# Patient Record
Sex: Female | Born: 1990 | Race: White | Hispanic: No | Marital: Married | Smoking: Never smoker
Health system: Southern US, Community
[De-identification: ages and names within clinical notes are randomized; demographics above are authoritative.]

## PROBLEM LIST (undated history)

## (undated) ENCOUNTER — Inpatient Hospital Stay (HOSPITAL_COMMUNITY): Payer: Self-pay

## (undated) DIAGNOSIS — K219 Gastro-esophageal reflux disease without esophagitis: Secondary | ICD-10-CM

## (undated) DIAGNOSIS — I471 Supraventricular tachycardia: Secondary | ICD-10-CM

## (undated) DIAGNOSIS — N390 Urinary tract infection, site not specified: Secondary | ICD-10-CM

## (undated) DIAGNOSIS — O163 Unspecified maternal hypertension, third trimester: Secondary | ICD-10-CM

## (undated) HISTORY — PX: ADENOIDECTOMY: SUR15

## (undated) HISTORY — DX: Urinary tract infection, site not specified: N39.0

---
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
## 2009-11-29 DIAGNOSIS — I471 Supraventricular tachycardia, unspecified: Secondary | ICD-10-CM

## 2009-11-29 HISTORY — DX: Supraventricular tachycardia, unspecified: I47.10

## 2009-11-29 HISTORY — DX: Supraventricular tachycardia: I47.1

## 2010-03-01 NOTE — L&D Delivery Note (Signed)
Operative Delivery Note At 11:21 PM a viable and healthy female was delivered via Vaginal, Spontaneous Delivery.  Presentation: vertex; Position: Right,, Occiput,, Anterior; Station: +4.  Verbal consent: obtained from patient.  Risks and benefits discussed in detail.  Risks include, but are not limited to the risks of anesthesia, bleeding, infection, damage to maternal tissues, fetal cephalhematoma.  There is also the risk of inability to effect vaginal delivery of the head, or shoulder dystocia that cannot be resolved by established maneuvers, leading to the need for emergency cesarean section.  APGAR: 9,9 ; weight 7 pounds and 2 ounces   Placenta status: spontaneous and intact   Cord: 3 vessels with the following complications: None.  Cord pH: na  Anesthesia: Epidural  Instruments: Kiwi cup x one pull, no pop offs Episiotomy: None Lacerations: 2nd degree Suture Repair: 2.0 3.0 vicryl rapide Est. Blood Loss (mL): 300  Mom to postpartum.  Baby to nursery-stable.  Kammi Hechler J 02/28/2011, 11:34 PM

## 2010-09-04 LAB — CULTURE, BETA STREP (GROUP B ONLY): Organism ID, Bacteria: POSITIVE

## 2010-09-19 ENCOUNTER — Encounter (HOSPITAL_COMMUNITY): Payer: Self-pay

## 2010-09-19 ENCOUNTER — Inpatient Hospital Stay (HOSPITAL_COMMUNITY)
Admission: AD | Admit: 2010-09-19 | Discharge: 2010-09-20 | DRG: 781 | Disposition: A | Discharge status: Home / Self Care | Payer: PRIVATE HEALTH INSURANCE | Source: Ambulatory Visit | Attending: Obstetrics and Gynecology | Admitting: Obstetrics and Gynecology

## 2010-09-19 ENCOUNTER — Inpatient Hospital Stay (HOSPITAL_COMMUNITY): Payer: PRIVATE HEALTH INSURANCE

## 2010-09-19 DIAGNOSIS — N390 Urinary tract infection, site not specified: Secondary | ICD-10-CM | POA: Diagnosis present

## 2010-09-19 DIAGNOSIS — A498 Other bacterial infections of unspecified site: Secondary | ICD-10-CM | POA: Diagnosis present

## 2010-09-19 DIAGNOSIS — O2390 Unspecified genitourinary tract infection in pregnancy, unspecified trimester: Principal | ICD-10-CM | POA: Diagnosis present

## 2010-09-19 DIAGNOSIS — O99891 Other specified diseases and conditions complicating pregnancy: Secondary | ICD-10-CM | POA: Diagnosis present

## 2010-09-19 DIAGNOSIS — R109 Unspecified abdominal pain: Secondary | ICD-10-CM | POA: Diagnosis present

## 2010-09-19 DIAGNOSIS — Z22330 Carrier of Group B streptococcus: Secondary | ICD-10-CM

## 2010-09-19 DIAGNOSIS — N200 Calculus of kidney: Secondary | ICD-10-CM | POA: Diagnosis present

## 2010-09-19 HISTORY — DX: Gastro-esophageal reflux disease without esophagitis: K21.9

## 2010-09-19 HISTORY — DX: Supraventricular tachycardia: I47.1

## 2010-09-19 LAB — DIFFERENTIAL
Basophils Absolute: 0 10*3/uL (ref 0.0–0.1)
Basophils Relative: 0 % (ref 0–1)
Eosinophils Absolute: 0.1 10*3/uL (ref 0.0–0.7)
Eosinophils Relative: 1 % (ref 0–5)
Lymphocytes Relative: 24 % (ref 12–46)
Lymphs Abs: 2.5 10*3/uL (ref 0.7–4.0)
Monocytes Absolute: 0.8 10*3/uL (ref 0.1–1.0)
Monocytes Relative: 7 % (ref 3–12)
Neutro Abs: 7.3 10*3/uL (ref 1.7–7.7)
Neutrophils Relative %: 68 % (ref 43–77)

## 2010-09-19 LAB — ABO/RH: RH Type: POSITIVE

## 2010-09-19 LAB — URINALYSIS, ROUTINE W REFLEX MICROSCOPIC
Bilirubin Urine: NEGATIVE
Leukocytes, UA: NEGATIVE
Nitrite: NEGATIVE
Specific Gravity, Urine: 1.02 (ref 1.005–1.030)
Urobilinogen, UA: 0.2 mg/dL (ref 0.0–1.0)
pH: 6 (ref 5.0–8.0)

## 2010-09-19 LAB — CBC
HCT: 36.4 % (ref 36.0–46.0)
Hemoglobin: 12.7 g/dL (ref 12.0–15.0)
MCH: 29.7 pg (ref 26.0–34.0)
MCHC: 34.9 g/dL (ref 30.0–36.0)
MCV: 85.2 fL (ref 78.0–100.0)
Platelets: 165 10*3/uL (ref 150–400)
RBC: 4.27 MIL/uL (ref 3.87–5.11)
RDW: 13 % (ref 11.5–15.5)
WBC: 10.7 10*3/uL — ABNORMAL HIGH (ref 4.0–10.5)

## 2010-09-19 MED ORDER — LACTATED RINGERS IV SOLN
INTRAVENOUS | Status: DC
  Administered 2010-09-19 – 2010-09-20 (×2): via INTRAVENOUS

## 2010-09-19 MED ORDER — SODIUM CHLORIDE 0.9 % IJ SOLN: 3.0000 mL | Freq: Two times a day (BID) | INTRAMUSCULAR | Status: DC

## 2010-09-19 MED ORDER — SODIUM CHLORIDE 0.9 % IV SOLN: Freq: Two times a day (BID) | INTRAVENOUS | Status: DC | PRN

## 2010-09-19 MED ORDER — PROMETHAZINE HCL 25 MG/ML IJ SOLN: 12.5000 mg | INTRAMUSCULAR | Status: DC | PRN

## 2010-09-19 MED ORDER — DEXTROSE 5 % IV SOLN
1.0000 g | Freq: Two times a day (BID) | INTRAVENOUS | Status: DC
  Administered 2010-09-19 – 2010-09-20 (×2): 1 g via INTRAVENOUS
  Filled 2010-09-19 (×4): qty 1

## 2010-09-19 MED ORDER — PRENATAL PLUS 27-1 MG PO TABS
1.0000 | ORAL_TABLET | Freq: Every day | ORAL | Status: DC
  Filled 2010-09-19 (×2): qty 1

## 2010-09-19 MED ORDER — SODIUM CHLORIDE 0.9 % IJ SOLN: 3.0000 mL | INTRAMUSCULAR | Status: DC | PRN

## 2010-09-19 MED ORDER — SODIUM CHLORIDE 0.9 % IV SOLN: 250.0000 mL | INTRAVENOUS | Status: DC

## 2010-09-19 MED ORDER — HYDROMORPHONE HCL 1 MG/ML IJ SOLN
0.5000 mg | INTRAMUSCULAR | Status: DC | PRN
  Administered 2010-09-20: 0.5 mg via INTRAVENOUS
  Filled 2010-09-19: qty 1

## 2010-09-19 MED ORDER — TRAMADOL HCL 50 MG PO TABS
50.0000 mg | ORAL_TABLET | Freq: Four times a day (QID) | ORAL | Status: DC | PRN
  Administered 2010-09-19: 50 mg via ORAL
  Filled 2010-09-19: qty 1

## 2010-09-19 MED ORDER — NALBUPHINE HCL 10 MG/ML IJ SOLN
10.0000 mg | Freq: Four times a day (QID) | INTRAMUSCULAR | Status: DC | PRN
  Administered 2010-09-19: 10 mg via INTRAVENOUS
  Filled 2010-09-19: qty 1

## 2010-09-19 NOTE — ED Provider Notes (Signed)
History     Chief Complaint  Patient presents with  . Flank Pain  . Urinary Tract Infection   Flank Pain  Urinary Tract Infection  Associated symptoms include flank pain.    OB History    Grav Para Term Preterm Abortions TAB SAB Ect Mult Living   1               No past medical history on file.  No past surgical history on file.  No family history on file.  History  Substance Use Topics  . Smoking status: Not on file  . Smokeless tobacco: Not on file  . Alcohol Use:     Allergies:  Allergies  Allergen Reactions  . Sulfa Antibiotics Rash    No prescriptions prior to admission    Review of Systems  Genitourinary: Positive for flank pain.    Onset acute flank pain Left side today. Recent UTI with GBS treated with 7 day course of Keflex. TOC positive for persistent GBS and Ecoli - initiated on 10 day course Macrobid, currently on day 3 of course. Denies fever or chills. No vaginal bleeding, vaginal discharge, cramping, or fluid leakage. Occasional ligament pain with movement. Pain is persistent with sharp pain at times. Pain 7/10 - desires analgesia. Some mild nausea, decreased appetite with pain. No vomiting. Normal BM - last BM yesterday.  Physical Exam   Blood pressure 125/89, pulse 85, temperature 98 F (36.7 C), resp. rate 18, height 5\' 5"  (1.651 m), weight 67.767 kg (149 lb 6.4 oz).  Physical Exam Alert and oriented x 3. Uncomfortable with pain Left flank area. Heart: RRR and Lungs: clear  Abdomen : soft, non-tender, non-distended                    fundal height mid-way between symphysis and umbilicus / nontender                    CVA Right - negative / CVA Left - positive Defer pelvic exam FHR per doppler 145 Ext: normal, no edema, no varicosities  MAU Course  Procedures : renal sono ordered   Assessment and Plan  [redacted] weeks gestation Urinary tract infection - possible ascending UTI Flank Pain - possible nephrolithiasis  Plan: 1)  urinalysis 2) CBCD 3) renal sono 4) IVF hydration and analgesia 5) re-assess after renal sono and anlagesia  Amanda Barker 09/19/2010, 5:33 PM

## 2010-09-19 NOTE — Progress Notes (Signed)
Pt reports having a UTI for the past month. Pt was on Keflex, repeat UA showed no clearing of the infection plus E. Coli. Pt has been on Macrobid for 2 days. Today pt reports L sided flank pain.

## 2010-09-19 NOTE — Progress Notes (Signed)
S: Intense pain again - nubain helped initially but now increasing again.   No nausea. Voiding QS with IV hydration.  O: 98.3 - 79 - 16 - 115/57  RADIOLOGY REPORT*  Clinical Data: [redacted] weeks pregnant with flank pain.  RENAL/URINARY TRACT ULTRASOUND COMPLETE  Comparison: None.  Findings:  Right Kidney: Normal in size and parenchymal echogenicity. No  evidence of mass or hydronephrosis.  Left Kidney: Normal in size and parenchymal echogenicity. No  evidence of mass or hydronephrosis.  Bladder: Appears normal for degree of bladder distention.  Bilateral ureteral jets are seen.  IMPRESSION:  Normal study.   Labs reviewed :  WBC 10.7 / HGB 12.7 / HCT 36.4 /  PLT 165 Urinalysis: 1020 specific gravity / negative micro  Assessment ; UTI Flank Pain Possible small kidney stone  Pain Oral analgesia now - if no improvement in 1 hour will admit  Marlinda Mike CNM MSN

## 2010-09-19 NOTE — Progress Notes (Signed)
Was diagnosed with UTI about one month ago was given Keflex diagnosed with E-Coli on Macrobid (3 doses) , onset of left flank pain today

## 2010-09-19 NOTE — H&P (Signed)
  Amanda Barker is a 20 y.o. female presenting for acute flank pain. Pregnancy at 16 weeks. PNCare at [facility] with Dr Amanda Barker as primary.   OB History    Grav Para Term Preterm Abortions TAB SAB Ect Mult Living   1              No past medical history on file. No past surgical history on file. Family History: family history is not on file. Social History:  does not have a smoking history on file. She does not have any smokeless tobacco history on file. Her alcohol and drug histories not on file.  Recent UTI - completed initial course of Keflex for GBS bacturia - TOC positive for GBS and Ecoli - on day 3 on Macrobid x 7 day course. No fever or chills.  No vaginal discharge, bleeding , or cramping. Normal BM, mild nausea, no vomiting.   see MAU note for physical exam  Assessment/Plan: [redacted] weeks pregnant Flank pain UTI - possible ascending / no evidence of pyelonephritis Possible nephrolithiasis / no visible stones or hydronephrosis on sono  1) Admit - pain management 2) Iv analgesia and antiemetic 3) Iv antibiotics while in-patient - restart Macrobid at time of discharge   Amanda Barker 09/19/2010, 10:18 PM

## 2010-09-20 ENCOUNTER — Other Ambulatory Visit: Payer: Self-pay

## 2010-09-20 ENCOUNTER — Encounter (HOSPITAL_COMMUNITY): Payer: Self-pay | Admitting: *Deleted

## 2010-09-20 DIAGNOSIS — N200 Calculus of kidney: Secondary | ICD-10-CM | POA: Diagnosis present

## 2010-09-20 DIAGNOSIS — I471 Supraventricular tachycardia, unspecified: Secondary | ICD-10-CM | POA: Insufficient documentation

## 2010-09-20 DIAGNOSIS — N390 Urinary tract infection, site not specified: Secondary | ICD-10-CM

## 2010-09-20 LAB — TSH: TSH: 2.074 u[IU]/mL (ref 0.350–4.500)

## 2010-09-20 LAB — GLUCOSE, CAPILLARY: Glucose-Capillary: 84 mg/dL (ref 70–99)

## 2010-09-20 MED ORDER — TRAMADOL HCL 50 MG PO TABS: 50.0000 mg | ORAL_TABLET | Freq: Four times a day (QID) | ORAL | Status: AC | PRN

## 2010-09-20 NOTE — Progress Notes (Addendum)
At 0115, I was called to patient's room. Patient complained of shortness of breath and labored breathing. She stated she was woken up from her sleep gasping for air. Patient visibly shaking, appears anxious. I auscultated patient's apical pulse, irregular. Vitals taken, stable (see doc flow sheets) pulse 100. I called respiratory to have a stat EKG (per Rennis Harding verbal report from speaking with her earlier). EKG was done, showing PAC's. Patient stated that she overall "just didn't feel good". I checked patient's CBG, it was 84. I called Marlinda Mike at [phone], notifying her of this event. Orders given to decrease continuous fluids. She gave instructions to give Tramadol as ordered instead of ordered Dilaudid. She mentioned having a cardiology consult in the morning.  Fluids decreased. Patient informed of plan of care. Patient verbalized understanding.  Will continue to monitor.

## 2010-09-20 NOTE — Progress Notes (Signed)
  S: Feeling much better - only slight backache on left this am 2/10     Tolerating PO intake     No palpitations this am - hx prior to pregnancy but only rarely     "sand particles" in urine strainer early this am - pain stopped after that time / urine dark brownish color with red streaks  O:  VS: Blood pressure 93/56, pulse 74, temperature 97.6 F (36.4 C), temperature source Oral, resp. rate 18, height 5\' 5"  (1.651 m), weight 67.767 kg (149 lb 6.4 oz), SpO2 99.00%.        FHR : 140-150 per doppler       Heart rate 80 with regular rhythm mild murmur       Lungs - clear, unlabored       Abdomen - soft, nontender       Urine dark brown this am with scant red blood / + stone particles in strainer       PAC per ECG - symptomatic after narcotic med / sedation       No previous cardiology evlaution             A: 16 weeks      UTI       Flank pain resolved - related to nephrolithiasis  P: D/C home today after 2nd dose of Rocephin      Continue Macrobid course at home - take all meds      Push PO meds      No calcium supplements - may need to change prenatal to low calcium option      OV at [facility] Friday as scheduled / will schedule outpt cardiology referral      No caffeine - will stimulate palpitations                 Reedy Biernat 09/20/2010, 7:53 AM

## 2010-09-20 NOTE — Discharge Summary (Signed)
      Feeling much better - only slight backache on left this am      Tolerating PO intake     No palpitations this am - hx prior to pregnancy but only rarely     "sand particles" in urine strainer early this am - pain stopped after that time / urine dark brownish color with red streaks       VS: Blood pressure 93/56, pulse 74, temperature 97.6 F (36.4 C), temperature source Oral, resp. rate 18, height 5\' 5"  (1.651 m), weight 67.767 kg (149 lb 6.4 oz), SpO2 99.00%.       FHR : 140-150 per doppler       Heart rate 80 with regular rhythm mild murmur       Lungs - clear, unlabored       Abdomen - soft, nontender       Urine dark brown this am with scant red blood / + stone particles in strainer       PAC per ECG - symptomatic after narcotic med / sedation       No previous cardiology evlaution             A: 16 weeks      UTI - GBS and Ecoli      Flank pain resolved - related to nephrolithiasis      PAC  P: D/C home today after 2nd dose of Rocephin      Continue Macrobid course at home - take all meds      Push PO meds      Regular diet      Activity ad lib      No calcium supplements - may need to change prenatal to low calcium option      OV at [facility] Friday as scheduled / will schedule outpt cardiology referral      No caffeine - will stimulate palpitations

## 2011-02-21 ENCOUNTER — Encounter (HOSPITAL_COMMUNITY): Payer: Self-pay | Admitting: *Deleted

## 2011-02-21 ENCOUNTER — Inpatient Hospital Stay (HOSPITAL_COMMUNITY)
Admission: AD | Admit: 2011-02-21 | Discharge: 2011-02-22 | Disposition: A | Discharge status: Home / Self Care | Payer: 59 | Source: Ambulatory Visit | Attending: Obstetrics | Admitting: Obstetrics

## 2011-02-21 DIAGNOSIS — O479 False labor, unspecified: Secondary | ICD-10-CM | POA: Insufficient documentation

## 2011-02-21 NOTE — Progress Notes (Signed)
Notified of pt presenting for labor check.  Notified of VE and ctx pattern.  Orders to recheck cervix after one hour.

## 2011-02-21 NOTE — Progress Notes (Signed)
Pt states contractions present since 1500

## 2011-02-22 NOTE — Progress Notes (Signed)
Monitors dc'd as pt wishes to stay out of bed at this time.  Reactive fetal tracing reviewed with charge nurse.

## 2011-02-22 NOTE — Consult Note (Signed)
NAMEMAKENZEE, CHOUDHRY           ACCOUNT NO.:  [id number]  MEDICAL RECORD NO.:  [id number]  LOCATION:  [facility]                          FACILITY:  [facility]  PHYSICIAN:  Lenoard Aden, M.D.DATE OF BIRTH:  1990/06/28  DATE OF CONSULTATION:  02/21/2011 DATE OF DISCHARGE:  02/22/2011                                CONSULTATION   CHIEF COMPLAINT:  Labor.  HISTORY OF PRESENT ILLNESS:  She is a 20 year old white female, G1, P0, at 37-1/7 weeks with increased contractions this evening.  She reports good fetal movement.  Denies bleeding or leakage of fluid.  MEDICATIONS:  Include prenatal vitamins.  ALLERGIES:  To SULFA,  XYLOCAINE, and LOCAL ANESTHETIC.  PAST MEDICAL HISTORY:  Remarkable for gastroesophageal reflux, supraventricular tachycardia and history of UTI.  This is her first pregnancy, and pregnancy course also complicated by GBS in the urine.  PHYSICAL EXAMINATION:  GENERAL:  This is a well-developed, well- nourished, white female, in no acute distress. VITAL SIGNS:  Blood pressure 150/80. HEENT:  Normal. NECK:  Supple.  Full range of motion. LUNGS:  Clear. HEART:  Regular rate and rhythm. ABDOMEN:  Soft, gravid and nontender.  Cervix is 2-3 cm, 50%, vertex -2. EXTREMITIES:  There are no cords. NEUROLOGIC:  Nonfocal. SKIN:  Intact.  NST reactive with irregular contractions every 4-6 minutes.  IMPRESSION:  Prodromal labor at 38 weeks.  PLAN:  Ambulate and recheck x1 hour.  Discharge home versus readmission pending recheck.    Lenoard Aden, M.D.    RJT/MEDQ  D:  02/21/2011  T:  02/22/2011  Job:  [id number]

## 2011-02-22 NOTE — Progress Notes (Signed)
Notified of subtle cervical exam change. Notified of increase in contractions.  May monitor for one more hour.  If no cervical change dc home.

## 2011-02-26 ENCOUNTER — Encounter (HOSPITAL_COMMUNITY): Payer: Self-pay | Admitting: *Deleted

## 2011-02-26 ENCOUNTER — Telehealth (HOSPITAL_COMMUNITY): Payer: Self-pay | Admitting: *Deleted

## 2011-02-26 NOTE — Telephone Encounter (Signed)
Preadmission screen  

## 2011-02-27 ENCOUNTER — Encounter (HOSPITAL_COMMUNITY): Payer: Self-pay

## 2011-02-27 ENCOUNTER — Other Ambulatory Visit: Payer: Self-pay | Admitting: Obstetrics and Gynecology

## 2011-02-27 ENCOUNTER — Inpatient Hospital Stay (HOSPITAL_COMMUNITY)
Admission: RE | Admit: 2011-02-27 | Discharge: 2011-03-02 | DRG: 775 | Disposition: A | Discharge status: Home / Self Care | Payer: 59 | Source: Ambulatory Visit | Attending: Obstetrics and Gynecology | Admitting: Obstetrics and Gynecology

## 2011-02-27 DIAGNOSIS — Z22330 Carrier of Group B streptococcus: Secondary | ICD-10-CM

## 2011-02-27 DIAGNOSIS — O4100X0 Oligohydramnios, unspecified trimester, not applicable or unspecified: Principal | ICD-10-CM | POA: Diagnosis present

## 2011-02-27 DIAGNOSIS — O99892 Other specified diseases and conditions complicating childbirth: Secondary | ICD-10-CM | POA: Diagnosis present

## 2011-02-27 LAB — CBC
MCH: 29.9 pg (ref 26.0–34.0)
Platelets: 159 10*3/uL (ref 150–400)
RBC: 3.75 MIL/uL — ABNORMAL LOW (ref 3.87–5.11)
RDW: 12.8 % (ref 11.5–15.5)
WBC: 9.6 10*3/uL (ref 4.0–10.5)

## 2011-02-27 MED ORDER — ZOLPIDEM TARTRATE 10 MG PO TABS
10.0000 mg | ORAL_TABLET | Freq: Every evening | ORAL | Status: DC | PRN
  Administered 2011-02-27: 10 mg via ORAL
  Filled 2011-02-27: qty 1

## 2011-02-27 MED ORDER — LIDOCAINE HCL (PF) 1 % IJ SOLN
30.0000 mL | INTRAMUSCULAR | Status: DC | PRN
  Filled 2011-02-27: qty 30

## 2011-02-27 MED ORDER — OXYCODONE-ACETAMINOPHEN 5-325 MG PO TABS: 2.0000 | ORAL_TABLET | ORAL | Status: DC | PRN

## 2011-02-27 MED ORDER — LACTATED RINGERS IV SOLN
INTRAVENOUS | Status: DC
  Administered 2011-02-28 (×2): via INTRAVENOUS

## 2011-02-27 MED ORDER — CITRIC ACID-SODIUM CITRATE 334-500 MG/5ML PO SOLN: 30.0000 mL | ORAL | Status: DC | PRN

## 2011-02-27 MED ORDER — DINOPROSTONE 10 MG VA INST
10.0000 mg | VAGINAL_INSERT | Freq: Once | VAGINAL | Status: AC
  Administered 2011-02-27: 10 mg via VAGINAL
  Filled 2011-02-27: qty 1

## 2011-02-27 MED ORDER — ACETAMINOPHEN 325 MG PO TABS: 650.0000 mg | ORAL_TABLET | ORAL | Status: DC | PRN

## 2011-02-27 MED ORDER — IBUPROFEN 600 MG PO TABS
600.0000 mg | ORAL_TABLET | Freq: Four times a day (QID) | ORAL | Status: DC | PRN
  Administered 2011-03-01: 600 mg via ORAL
  Filled 2011-02-27: qty 1

## 2011-02-27 MED ORDER — TERBUTALINE SULFATE 1 MG/ML IJ SOLN: 0.2500 mg | Freq: Once | INTRAMUSCULAR | Status: AC | PRN

## 2011-02-27 MED ORDER — PENICILLIN G POTASSIUM 5000000 UNITS IJ SOLR
2.5000 10*6.[IU] | INTRAVENOUS | Status: DC
  Administered 2011-02-28 (×3): 2.5 10*6.[IU] via INTRAVENOUS
  Filled 2011-02-27 (×8): qty 2.5

## 2011-02-27 MED ORDER — LACTATED RINGERS IV SOLN
500.0000 mL | INTRAVENOUS | Status: DC | PRN
  Administered 2011-02-28: 1000 mL via INTRAVENOUS

## 2011-02-27 MED ORDER — PENICILLIN G POTASSIUM 5000000 UNITS IJ SOLR
5.0000 10*6.[IU] | Freq: Once | INTRAVENOUS | Status: AC
  Administered 2011-02-28: 5 10*6.[IU] via INTRAVENOUS
  Filled 2011-02-27: qty 5

## 2011-02-27 MED ORDER — ONDANSETRON HCL 4 MG/2ML IJ SOLN
4.0000 mg | Freq: Four times a day (QID) | INTRAMUSCULAR | Status: DC | PRN
  Administered 2011-02-28: 4 mg via INTRAVENOUS
  Filled 2011-02-27: qty 2

## 2011-02-27 NOTE — H&P (Signed)
Amanda Barker, LEIST           ACCOUNT NO.:  [id number]  MEDICAL RECORD NO.:  [id number]  LOCATION:  9163                          [facility]ACILITY:  [facility]  PHYSICIAN:  Lenoard Aden, M.D.DATE OF BIRTH:  January 29, 1991  DATE OF ADMISSION:  02/27/2011 DATE OF DISCHARGE:                             HISTORY & PHYSICAL   CHIEF COMPLAINT:  Oligohydramnios with history of continued leakage of fluid, but otherwise normal the dropping AFI over the past week and GBS positive for induction now at 39 weeks due to concerns of questionable high leak of amniotic fluid.  HISTORY OF PRESENT ILLNESS:  She is a 20 year old white female, G1, P0 with aforementioned history who presents now for induction.  PAST MEDICAL HISTORY:  Remarkable.  ALLERGIES:  Sulfa and Xylocaine.  MEDICATIONS:  Prenatal vitamins.  She has a history of GBS bacteriuria.  FAMILY HISTORY:  Hypertension and diabetes.  SOCIAL HISTORY:  She is a nonsmoker, nondrinker.  She denies domestic or physical violence.  This is her first pregnancy.  PHYSICAL EXAMINATION:  GENERAL:  She is a well-developed, well- nourished, white female, in no acute distress. HEENT:  Normal. NECK:  Supple.  Full range of motion. LUNGS:  Clear. HEART:  Regular rate and rhythm. ABDOMEN:  Soft, gravid, nontender.  Estimated fetal weight 7 pounds. Cervix is 2-3 cm, 70%, vertex -1. EXTREMITIES:  There are no cords. NEUROLOGIC:  Nonfocal. SKIN:  Intact.  IMPRESSION: 1. Thirty-nine week intrauterine pregnancy. 2. Questionable history of leakage of fluid.  History of repeat     negative fern and Nitrazine with dropping AFI noted on ultrasound.     Given concerns regarding possible high leak and a history of GBS     bacteriuria, decision was made to proceed with delivery at 39     weeks.  PLAN:  Place Cervidil tonight and Pitocin in a.m.  Start penicillin with active labor.  Anticipate cautious attempts for vaginal delivery.     Lenoard Aden,  M.D.     RJT/MEDQ  D:  02/27/2011  T:  02/27/2011  Job:  [id number]

## 2011-02-27 NOTE — Progress Notes (Signed)
Mushka Laconte is a 20 y.o. G1P0000 at 108w0d by LMP admitted for oligohydramnios with ? LOF over last week. Sono c/w with dropping AFI.   Subjective: Good FM ?LOF   Objective: BP 137/94  Pulse 105  Temp(Src) 97.9 F (36.6 C) (Oral)  Resp 18  Ht 5\' 5"  (1.651 m)  Wt 87.544 kg (193 lb)  BMI 32.12 kg/m2      FHT: FHT 150s , reactive, CAtegory 1 UC:   rare SVE:    2-3/60-1  Cervidil placed. Labs: Lab Results  Component Value Date   WBC 10.7* 09/19/2010   HGB 12.7 09/19/2010   HCT 36.4 09/19/2010   MCV 85.2 09/19/2010   PLT 165 09/19/2010    Assessment / Plan: 39 week IUP Oligohydramnios-AGA GBS Bacteruria  Labor: Cervidil tonight, Pitocin in am Preeclampsia:  na Fetal Wellbeing:  Category 1 Pain Control:  Labor support without medications I/D:  n/a Anticipated MOD:  NSVD  Shaliah Wann J 02/27/2011, 8:25 PM

## 2011-02-28 ENCOUNTER — Inpatient Hospital Stay (HOSPITAL_COMMUNITY): Payer: 59 | Admitting: Anesthesiology

## 2011-02-28 ENCOUNTER — Encounter (HOSPITAL_COMMUNITY): Payer: Self-pay | Admitting: Anesthesiology

## 2011-02-28 DIAGNOSIS — O4100X0 Oligohydramnios, unspecified trimester, not applicable or unspecified: Secondary | ICD-10-CM | POA: Diagnosis present

## 2011-02-28 LAB — RPR: RPR Ser Ql: NONREACTIVE

## 2011-02-28 MED ORDER — OXYTOCIN BOLUS FROM INFUSION
500.0000 mL | Freq: Once | INTRAVENOUS | Status: DC
  Filled 2011-02-28: qty 500
  Filled 2011-02-28: qty 1000

## 2011-02-28 MED ORDER — OXYTOCIN 20 UNITS IN LACTATED RINGERS INFUSION - SIMPLE
1.0000 m[IU]/min | INTRAVENOUS | Status: DC
  Administered 2011-02-28: 4 m[IU]/min via INTRAVENOUS
  Filled 2011-02-28: qty 1000

## 2011-02-28 MED ORDER — LACTATED RINGERS IV SOLN
500.0000 mL | Freq: Once | INTRAVENOUS | Status: AC
  Administered 2011-02-28: 1000 mL via INTRAVENOUS

## 2011-02-28 MED ORDER — EPHEDRINE 5 MG/ML INJ: 10.0000 mg | INTRAVENOUS | Status: DC | PRN

## 2011-02-28 MED ORDER — DIPHENHYDRAMINE HCL 50 MG/ML IJ SOLN: 12.5000 mg | INTRAMUSCULAR | Status: DC | PRN

## 2011-02-28 MED ORDER — EPHEDRINE 5 MG/ML INJ
10.0000 mg | INTRAVENOUS | Status: AC | PRN
  Administered 2011-02-28: 20 mg via INTRAVENOUS
  Administered 2011-02-28: 10 mg via INTRAVENOUS
  Filled 2011-02-28: qty 4

## 2011-02-28 MED ORDER — PHENYLEPHRINE 40 MCG/ML (10ML) SYRINGE FOR IV PUSH (FOR BLOOD PRESSURE SUPPORT)
80.0000 ug | PREFILLED_SYRINGE | INTRAVENOUS | Status: DC | PRN
  Filled 2011-02-28: qty 5

## 2011-02-28 MED ORDER — LIDOCAINE HCL 1.5 % IJ SOLN
INTRAMUSCULAR | Status: DC | PRN
  Administered 2011-02-28 (×2): 5 mL via EPIDURAL

## 2011-02-28 MED ORDER — PHENYLEPHRINE 40 MCG/ML (10ML) SYRINGE FOR IV PUSH (FOR BLOOD PRESSURE SUPPORT): 80.0000 ug | PREFILLED_SYRINGE | INTRAVENOUS | Status: DC | PRN

## 2011-02-28 MED ORDER — OXYTOCIN 20 UNITS IN LACTATED RINGERS INFUSION - SIMPLE
125.0000 mL/h | Freq: Once | INTRAVENOUS | Status: DC
  Administered 2011-02-28: 999 mL/h via INTRAVENOUS

## 2011-02-28 MED ORDER — FENTANYL 2.5 MCG/ML BUPIVACAINE 1/10 % EPIDURAL INFUSION (WH - ANES)
14.0000 mL/h | INTRAMUSCULAR | Status: DC
  Administered 2011-02-28: 14.5 mL/h via EPIDURAL
  Administered 2011-02-28 (×3): 14 mL/h via EPIDURAL
  Filled 2011-02-28 (×4): qty 60

## 2011-02-28 NOTE — Anesthesia Preprocedure Evaluation (Signed)
Anesthesia Evaluation  Patient identified by MRN, date of birth, ID band Patient awake    Reviewed: Allergy & Precautions, H&P , Patient's Chart, lab work & pertinent test results  Airway Mallampati: II TM Distance: >3 FB Neck ROM: full    Dental No notable dental hx.    Pulmonary neg pulmonary ROS,  clear to auscultation  Pulmonary exam normal       Cardiovascular neg cardio ROS + dysrhythmias regular Normal    Neuro/Psych Negative Neurological ROS  Negative Psych ROS   GI/Hepatic negative GI ROS, Neg liver ROS, GERD-  ,  Endo/Other  Negative Endocrine ROS  Renal/GU negative Renal ROS     Musculoskeletal   Abdominal   Peds  Hematology negative hematology ROS (+)   Anesthesia Other Findings Past xyocaine allergy not likely... Pt reports possible seizure as a child  SVT only as a child   Reproductive/Obstetrics (+) Pregnancy                           Anesthesia Physical Anesthesia Plan  ASA: II  Anesthesia Plan: Epidural   Post-op Pain Management:    Induction:   Airway Management Planned:   Additional Equipment:   Intra-op Plan:   Post-operative Plan:   Informed Consent: I have reviewed the patients History and Physical, chart, labs and discussed the procedure including the risks, benefits and alternatives for the proposed anesthesia with the patient or authorized representative who has indicated his/her understanding and acceptance.     Plan Discussed with:   Anesthesia Plan Comments:         Anesthesia Quick Evaluation

## 2011-02-28 NOTE — Progress Notes (Signed)
Amanda Barker is a 20 y.o. G1P0000 at [redacted]w[redacted]d by LMP admitted for oligo at 39 weeks.  Subjective: comfortable with occ rectal pressure  Objective: BP 139/83  Pulse 102  Temp(Src) 97.6 F (36.4 C) (Oral)  Resp 20  Ht 5\' 5"  (1.651 m)  Wt 87.544 kg (193 lb)  BMI 32.12 kg/m2      FHT:  FHR: 140 bpm, variability: marked,  accelerations:  Present,  decelerations:  Absent UC:   regular, every 2-3 minutes but inadequate by IUPC SVE:   6-7/100/+1 IUPC replace without difficulty  Labs: Lab Results  Component Value Date   WBC 9.6 02/27/2011   HGB 11.2* 02/27/2011   HCT 33.1* 02/27/2011   MCV 88.3 02/27/2011   PLT 159 02/27/2011    Assessment / Plan: Induction of labor due to oligohydramnios,  progressing well on pitocin  Labor: Slow progress on Pitocin with hypotonic labor by IUPC Preeclampsia:  na Fetal Wellbeing:  Category I Pain Control:  Epidural I/D:  n/a Anticipated MOD:  NSVD  Aldean Suddeth J 02/28/2011, 6:40 PM

## 2011-02-28 NOTE — Progress Notes (Signed)
Amanda Barker is a 20 y.o. G1P0000 at [redacted]w[redacted]d by LMP admitted for induction of labor due to oligohydramnios.  Subjective: Comfortable with epidural   Objective: BP 137/90  Pulse 99  Temp(Src) 97.3 F (36.3 C) (Oral)  Resp 20  Ht 5\' 5"  (1.651 m)  Wt 87.544 kg (193 lb)  BMI 32.12 kg/m2      FHT:  FHR: 140 bpm, variability: moderate,  accelerations:  Present,  decelerations:  Absent and   UC:   irregular, every 2-5 minutes SVE:   Dilation: 4 Effacement (%): 80 Station: 0 Exam by:: [email or web address] LOT vs LOP IUPC placed without difficulty  Labs: Lab Results  Component Value Date   WBC 9.6 02/27/2011   HGB 11.2* 02/27/2011   HCT 33.1* 02/27/2011   MCV 88.3 02/27/2011   PLT 159 02/27/2011    Assessment / Plan: Induction of labor due to oligohydramnios,  progressing well on pitocin  Labor: Progressing normally on Pitocin now with IUPC Preeclampsia:  na Fetal Wellbeing:  Category I Pain Control:  Epidural I/D:  n/a Anticipated MOD:  NSVD  Amanda Barker J 02/28/2011, 1:45 PM

## 2011-02-28 NOTE — Progress Notes (Signed)
Patient ID: Amanda Barker, female   DOB: 01-16-1991, 20 y.o.   MRN: [id number]  Sent by dr Billy Coast to examine and AROM - induction of labor for oligiohydramnios  S: Feeling ctx - not painful but really tight with mostly intense back pain     Tolerating contractions well - breathing with ctx / family at bedside for support  O:  VS: Blood pressure 106/78, pulse 82, temperature 97.5 F (36.4 C), temperature source Oral, resp. rate 20, height 5\' 5"  (1.651 m), weight 87.544 kg (193 lb).        FHR : baseline 130 / variability moderate / accels + / decels none        Toco: contractions every 3 minutes / x 60 sec / pitocin at 8 mu/min        Cervix : 3 / 80 / vtx /-1 / BBOW / LOP        Membranes: AROM - clear with moderate amount        PCN protocol initiated on admission  A: Induction of labor for oligiohydramnios in latent labor phase     + GBS     FHR category 1  P: Adjust pitocin to maintain adequate ctx pattern for labor progression      Continue PCN protocol      Dr Billy Coast updated - MD to follow       [facility] 02/28/2011, 10:02 AM

## 2011-02-28 NOTE — Anesthesia Procedure Notes (Signed)
Epidural Patient location during procedure: OB Start time: 02/28/2011 11:39 AM  Staffing Anesthesiologist: Brayton Caves R Performed by: anesthesiologist   Preanesthetic Checklist Completed: patient identified, site marked, surgical consent, pre-op evaluation, timeout performed, IV checked, risks and benefits discussed and monitors and equipment checked  Epidural Patient position: sitting Prep: site prepped and draped and DuraPrep Patient monitoring: continuous pulse ox and blood pressure Approach: midline Injection technique: LOR air and LOR saline  Needle:  Needle type: Tuohy  Needle gauge: 17 G Needle length: 9 cm Needle insertion depth: 5 cm cm Catheter type: closed end flexible Catheter size: 19 Gauge Catheter at skin depth: 10 cm Test dose: negative  Assessment Events: blood not aspirated, injection not painful, no injection resistance, negative IV test and no paresthesia  Additional Notes Patient identified.  Risk benefits discussed including failed block, incomplete pain control, headache, nerve damage, paralysis, blood pressure changes, nausea, vomiting, reactions to medication both toxic or allergic, and postpartum back pain.  Patient expressed understanding and wished to proceed.  All questions were answered.  Sterile technique used throughout procedure and epidural site dressed with sterile barrier dressing. No paresthesia or other complications noted.The patient did not experience any signs of intravascular injection such as tinnitus or metallic taste in mouth nor signs of intrathecal spread such as rapid motor block. Please see nursing notes for vital signs.

## 2011-03-01 ENCOUNTER — Encounter (HOSPITAL_COMMUNITY): Payer: Self-pay

## 2011-03-01 LAB — CBC
MCH: 29.9 pg (ref 26.0–34.0)
MCHC: 34.2 g/dL (ref 30.0–36.0)
MCV: 87.5 fL (ref 78.0–100.0)
Platelets: 141 10*3/uL — ABNORMAL LOW (ref 150–400)
RBC: 3.21 MIL/uL — ABNORMAL LOW (ref 3.87–5.11)

## 2011-03-01 MED ORDER — WITCH HAZEL-GLYCERIN EX PADS: 1.0000 "application " | MEDICATED_PAD | CUTANEOUS | Status: DC | PRN

## 2011-03-01 MED ORDER — BENZOCAINE-MENTHOL 20-0.5 % EX AERO
INHALATION_SPRAY | CUTANEOUS | Status: AC
  Administered 2011-03-01: 1 via TOPICAL
  Filled 2011-03-01: qty 56

## 2011-03-01 MED ORDER — TETANUS-DIPHTH-ACELL PERTUSSIS 5-2.5-18.5 LF-MCG/0.5 IM SUSP
0.5000 mL | Freq: Once | INTRAMUSCULAR | Status: AC
  Administered 2011-03-01: 0.5 mL via INTRAMUSCULAR
  Filled 2011-03-01: qty 0.5

## 2011-03-01 MED ORDER — FLEET ENEMA 7-19 GM/118ML RE ENEM: 1.0000 | ENEMA | RECTAL | Status: DC | PRN

## 2011-03-01 MED ORDER — LANOLIN HYDROUS EX OINT: TOPICAL_OINTMENT | CUTANEOUS | Status: DC | PRN

## 2011-03-01 MED ORDER — IBUPROFEN 600 MG PO TABS
600.0000 mg | ORAL_TABLET | Freq: Four times a day (QID) | ORAL | Status: DC
  Administered 2011-03-01 – 2011-03-02 (×6): 600 mg via ORAL
  Filled 2011-03-01 (×6): qty 1

## 2011-03-01 MED ORDER — DIBUCAINE 1 % RE OINT: 1.0000 "application " | TOPICAL_OINTMENT | RECTAL | Status: DC | PRN

## 2011-03-01 MED ORDER — METHYLERGONOVINE MALEATE 0.2 MG/ML IJ SOLN: 0.2000 mg | INTRAMUSCULAR | Status: DC | PRN

## 2011-03-01 MED ORDER — OXYTOCIN 20 UNITS IN LACTATED RINGERS INFUSION - SIMPLE: 125.0000 mL/h | INTRAVENOUS | Status: DC

## 2011-03-01 MED ORDER — ONDANSETRON HCL 4 MG/2ML IJ SOLN: 4.0000 mg | INTRAMUSCULAR | Status: DC | PRN

## 2011-03-01 MED ORDER — METHYLERGONOVINE MALEATE 0.2 MG PO TABS: 0.2000 mg | ORAL_TABLET | ORAL | Status: DC | PRN

## 2011-03-01 MED ORDER — OXYCODONE-ACETAMINOPHEN 5-325 MG PO TABS: 1.0000 | ORAL_TABLET | ORAL | Status: DC | PRN

## 2011-03-01 MED ORDER — PRENATAL MULTIVITAMIN CH
1.0000 | ORAL_TABLET | Freq: Every day | ORAL | Status: DC
  Administered 2011-03-01 – 2011-03-02 (×2): 1 via ORAL
  Filled 2011-03-01 (×2): qty 1

## 2011-03-01 MED ORDER — ZOLPIDEM TARTRATE 5 MG PO TABS: 5.0000 mg | ORAL_TABLET | Freq: Every evening | ORAL | Status: DC | PRN

## 2011-03-01 MED ORDER — BENZOCAINE-MENTHOL 20-0.5 % EX AERO
1.0000 "application " | INHALATION_SPRAY | CUTANEOUS | Status: DC | PRN
  Administered 2011-03-01: 1 via TOPICAL

## 2011-03-01 MED ORDER — ONDANSETRON HCL 4 MG PO TABS: 4.0000 mg | ORAL_TABLET | ORAL | Status: DC | PRN

## 2011-03-01 MED ORDER — SENNOSIDES-DOCUSATE SODIUM 8.6-50 MG PO TABS
2.0000 | ORAL_TABLET | Freq: Every day | ORAL | Status: DC
  Administered 2011-03-01: 2 via ORAL

## 2011-03-01 MED ORDER — SIMETHICONE 80 MG PO CHEW: 80.0000 mg | CHEWABLE_TABLET | ORAL | Status: DC | PRN

## 2011-03-01 MED ORDER — DIPHENHYDRAMINE HCL 25 MG PO CAPS: 25.0000 mg | ORAL_CAPSULE | Freq: Four times a day (QID) | ORAL | Status: DC | PRN

## 2011-03-01 NOTE — Progress Notes (Signed)
Patient ID: Amanda Barker, female   DOB: 1990-11-28, 20 y.o.   MRN: [id number]   PPD 1 SVD  S:  Reports feeling well / tired / bottom really sore and swollen             Tolerating po/ No nausea or vomiting             Bleeding is light             Pain controlled withprescription NSAID's including motrin and percocet             Up ad lib / ambulatory / voiding QS / no PIH symptoms  Newborn breast feeding  / Circumcision requested - prefers Plastibell if MD agrees   O:  A & O x 3 NAD             VS: Blood pressure 129/85, pulse 87, temperature 97.5 F (36.4 C), temperature source Oral, resp. rate 16, height 5\' 5"  (1.651 m), weight 87.544 kg (193 lb), SpO2 98.00%, unknown if currently breastfeeding.  LABS: WBC/Hgb/Hct/Plts:  15.3/9.6/28.1/141 (12/31 0528)   Lungs: Clear and unlabored  Heart: regular rate and rhythm / no mumurs  Abdomen: soft, non-tender, non-distended              Fundus: firm, non-tender, U-1  Perineum: + edema / ice pack in place  Lochia: light  Extremities: no edema, no calf pain or tenderness    A: PPD # 1   Doing well - stable status  P:  Routine post partum orders  Anticipate discharge in am  Peony Barner 03/01/2011, 9:06 AM

## 2011-03-01 NOTE — Progress Notes (Signed)
Vacuum assisted delivery of a viable female. 

## 2011-03-01 NOTE — Anesthesia Postprocedure Evaluation (Signed)
  Anesthesia Post-op Note  Patient: Amanda Barker  Procedure(s) Performed: * No procedures listed *  Patient Location: Mother/Baby  Anesthesia Type: Epidural  Level of Consciousness: awake and alert   Airway and Oxygen Therapy: Patient Spontanous Breathing  Post-op Assessment: Patient's Cardiovascular Status Stable and Respiratory Function Stable  Post-op Vital Signs: Reviewed and stable  Complications: No apparent anesthesia complications

## 2011-03-01 NOTE — Progress Notes (Signed)
Dr. Billy Coast notified of pt status, FHR, and station. Will continue to monitor.

## 2011-03-02 MED ORDER — IBUPROFEN 600 MG PO TABS: 600.0000 mg | ORAL_TABLET | Freq: Four times a day (QID) | ORAL | Status: AC | PRN

## 2011-03-02 NOTE — Discharge Summary (Signed)
Obstetric Discharge Summary Reason for Admission: induction of labor Prenatal Procedures: NST and ultrasound Intrapartum Procedures: spontaneous vaginal delivery Postpartum Procedures: none Complications-Operative and Postpartum: 2nd degree perineal laceration Hemoglobin  Date Value Range Status  03/01/2011 9.6* 12.0-15.0 (g/dL) Final     HCT  Date Value Range Status  03/01/2011 28.1* 36.0-46.0 (%) Final    Discharge Diagnoses: Term Pregnancy-delivered  Discharge Information: Date: 03/02/2011 Activity: pelvic rest Diet: routine Medications: PNV and Ibuprofen Condition: stable Instructions: refer to practice specific booklet Discharge to: home Follow-up Information    Follow up with Lenoard Aden, MD. Make an appointment in 6 weeks.   Contact information:   [address] [place] [address] [phone]          Newborn Data: Live born female  Birth Weight: 7 lb 3 oz (3260 g) APGAR: 9, 9  Home with mother.  Amanda Barker 03/02/2011, 9:47 AM

## 2011-03-02 NOTE — Progress Notes (Signed)
Patient ID: Amanda Barker, female   DOB: 11-27-1990, 21 y.o.   MRN: [id number]  PPD 2 SVD  S:  Reports feeling well             Tolerating po/ No nausea or vomiting             Bleeding is light             Pain controlled withprescription NSAID's including motrin             Up ad lib / ambulatory  Newborn breast feeding  / Circumcision done   O:  A & O x 3 NAD             VS: Blood pressure 125/81, pulse 71, temperature 98 F (36.7 C), temperature source Oral, resp. rate 18, height 5\' 5"  (1.651 m), weight 87.544 kg (193 lb), SpO2 98.00%, unknown if currently breastfeeding.  Lungs: Clear and unlabored  Heart: regular rate and rhythm / no mumurs  Abdomen: soft, non-tender, non-distended              Fundus: firm, non-tender, U-1  Perineum: decreased edema  Lochia: light  Extremities: no edema, no calf pain or tenderness    A: PPD # 2   Doing well - stable status  P:  Routine post partum orders  Discharge to home  [facility] 03/02/2011, 9:45 AM

## 2011-05-31 ENCOUNTER — Inpatient Hospital Stay (HOSPITAL_COMMUNITY): Admission: AD | Admit: 2011-05-31 | Payer: Self-pay | Source: Ambulatory Visit | Admitting: Obstetrics and Gynecology

## 2013-04-08 ENCOUNTER — Ambulatory Visit (INDEPENDENT_AMBULATORY_CARE_PROVIDER_SITE_OTHER): Payer: 59 | Admitting: Physician Assistant

## 2013-04-08 VITALS — BP 110/60 | HR 87 | Temp 97.8°F | Resp 16 | Ht 64.0 in | Wt 156.8 lb

## 2013-04-08 DIAGNOSIS — R300 Dysuria: Secondary | ICD-10-CM

## 2013-04-08 DIAGNOSIS — N390 Urinary tract infection, site not specified: Secondary | ICD-10-CM

## 2013-04-08 LAB — POCT UA - MICROSCOPIC ONLY
Casts, Ur, LPF, POC: NEGATIVE
Mucus, UA: POSITIVE
Yeast, UA: NEGATIVE

## 2013-04-08 LAB — POCT URINALYSIS DIPSTICK
Bilirubin, UA: NEGATIVE
GLUCOSE UA: NEGATIVE
Leukocytes, UA: NEGATIVE
Nitrite, UA: NEGATIVE
Protein, UA: 100
SPEC GRAV UA: 1.025
UROBILINOGEN UA: 0.2
pH, UA: 7

## 2013-04-08 MED ORDER — PHENAZOPYRIDINE HCL 200 MG PO TABS: 200.0000 mg | ORAL_TABLET | Freq: Three times a day (TID) | ORAL | Status: DC | PRN

## 2013-04-08 MED ORDER — AMOXICILLIN-POT CLAVULANATE 875-125 MG PO TABS: 1.0000 | ORAL_TABLET | Freq: Two times a day (BID) | ORAL | Status: DC

## 2013-04-08 NOTE — Patient Instructions (Signed)
Take the antibiotics as directed.  Take with food to reduce stomach upset  Use the pyridium if needed  Plenty of fluids (water is best!)  I will let you know when the culture is back and if we need to make any changes to medication  Please let us know if any symptoms are worsening or not improving   Urinary Tract Infection Urinary tract infections (UTIs) can develop anywhere along your urinary tract. Your urinary tract is your body's drainage system for removing wastes and extra water. Your urinary tract includes two kidneys, two ureters, a bladder, and a urethra. Your kidneys are a pair of bean-shaped organs. Each kidney is about the size of your fist. They are located below your ribs, one on each side of your spine. CAUSES Infections are caused by microbes, which are microscopic organisms, including fungi, viruses, and bacteria. These organisms are so small that they can only be seen through a microscope. Bacteria are the microbes that most commonly cause UTIs. SYMPTOMS  Symptoms of UTIs may vary by age and gender of the patient and by the location of the infection. Symptoms in young women typically include a frequent and intense urge to urinate and a painful, burning feeling in the bladder or urethra during urination. Older women and men are more likely to be tired, shaky, and weak and have muscle aches and abdominal pain. A fever may mean the infection is in your kidneys. Other symptoms of a kidney infection include pain in your back or sides below the ribs, nausea, and vomiting. DIAGNOSIS To diagnose a UTI, your caregiver will ask you about your symptoms. Your caregiver also will ask to provide a urine sample. The urine sample will be tested for bacteria and white blood cells. White blood cells are made by your body to help fight infection. TREATMENT  Typically, UTIs can be treated with medication. Because most UTIs are caused by a bacterial infection, they usually can be treated with the  use of antibiotics. The choice of antibiotic and length of treatment depend on your symptoms and the type of bacteria causing your infection. HOME CARE INSTRUCTIONS  If you were prescribed antibiotics, take them exactly as your caregiver instructs you. Finish the medication even if you feel better after you have only taken some of the medication.  Drink enough water and fluids to keep your urine clear or pale yellow.  Avoid caffeine, tea, and carbonated beverages. They tend to irritate your bladder.  Empty your bladder often. Avoid holding urine for long periods of time.  Empty your bladder before and after sexual intercourse.  After a bowel movement, women should cleanse from front to back. Use each tissue only once. SEEK MEDICAL CARE IF:   You have back pain.  You develop a fever.  Your symptoms do not begin to resolve within 3 days. SEEK IMMEDIATE MEDICAL CARE IF:   You have severe back pain or lower abdominal pain.  You develop chills.  You have nausea or vomiting.  You have continued burning or discomfort with urination. MAKE SURE YOU:   Understand these instructions.  Will watch your condition.  Will get help right away if you are not doing well or get worse. Document Released: 11/25/2004 Document Revised: 08/17/2011 Document Reviewed: 03/26/2011 [facility] Patient Information 2014 [place].

## 2013-04-08 NOTE — Progress Notes (Addendum)
Subjective:    Patient ID: Amanda Barker, female    DOB: Apr 28, 1990, 23 y.o.   MRN: [id number]  HPI   Amanda Barker is a very pleasant 23 yr old female here with concern for UTI.  Symptoms include dysuria, bladder pressure.  Symptoms began about 3 days ago.  Denies hematuria, abd pain, back pain, NV, FC.  She is staying well hydrated.  Has had  UTIs in the past - about 2 yrs ago had a UTI while pregnant --> pyelo --> kidney stone; prolonged course of abx.  She has an allergy to sulfa drugs.  Denies vaginal symptoms or concern for STI   Review of Systems  Constitutional: Negative for fever and chills.  Respiratory: Negative.   Cardiovascular: Negative.   Gastrointestinal: Negative for nausea, vomiting and abdominal pain.  Genitourinary: Positive for dysuria. Negative for hematuria, flank pain and vaginal discharge.  Musculoskeletal: Negative.   Skin: Negative.        Objective:   Physical Exam  Vitals reviewed. Constitutional: She is oriented to person, place, and time. She appears well-developed and well-nourished. No distress.  HENT:  Head: Normocephalic and atraumatic.  Eyes: Conjunctivae are normal. No scleral icterus.  Cardiovascular: Normal rate, regular rhythm and normal heart sounds.   Pulmonary/Chest: Effort normal and breath sounds normal. She has no wheezes. She has no rales.  Abdominal: Soft. Bowel sounds are normal. There is no tenderness. There is no CVA tenderness.  Neurological: She is alert and oriented to person, place, and time.  Skin: Skin is warm and dry.  Psychiatric: She has a normal mood and affect. Her behavior is normal.    Results for orders placed in visit on 04/08/13  POCT URINALYSIS DIPSTICK      Result Value Range   Color, UA YELLOW     Clarity, UA CLOUDY     Glucose, UA NEG     Bilirubin, UA NEG     Ketones, UA TRACE     Spec Grav, UA 1.025     Blood, UA TRACE     pH, UA 7.0     Protein, UA 100     Urobilinogen, UA 0.2     Nitrite,  UA NEG     Leukocytes, UA Negative    POCT UA - MICROSCOPIC ONLY      Result Value Range   WBC, Ur, HPF, POC 2-7     RBC, urine, microscopic 18-21     Bacteria, U Microscopic 4+     Mucus, UA POS     Epithelial cells, urine per micros 1-3     Crystals, Ur, HPF, POC TRIPLE PHOSPHATE     Casts, Ur, LPF, POC NEG     Yeast, UA NEG         Assessment & Plan:  UTI (urinary tract infection) - Plan: amoxicillin-clavulanate (AUGMENTIN) 875-125 MG per tablet  Dysuria - Plan: POCT urinalysis dipstick, POCT UA - Microscopic Only, Urine culture, phenazopyridine (PYRIDIUM) 200 MG tablet    Amanda Barker is a very pleasant 23 yr old female with UTI.  Will send cx.  Given presence of triple phosphate crystals, concerned for klebsiella or proteus.  Due to sulfa allergy, will treat empirically with augmentin which should provide good coverage for e coli, kleb, and proteus.  Will adjust therapy if necessary based on culture.  Push fluids.  Pyridium if needed.  Pt to call or RTC if worsening or not improving.  Alonza Smoker MHS, PA-C [facility] 2/8/20159:30 PM   Addendum 04/11/13: Urine cx with >100,000 cfu coag neg staph - will change to macrobid bid x 5 days

## 2013-04-11 LAB — URINE CULTURE: Colony Count: >=100000

## 2013-04-11 MED ORDER — NITROFURANTOIN MONOHYD MACRO 100 MG PO CAPS: 100.0000 mg | ORAL_CAPSULE | Freq: Two times a day (BID) | ORAL | Status: DC

## 2013-04-11 NOTE — Addendum Note (Signed)
Addended by: Theda Sers on: 04/11/2013 11:06 AM   Modules accepted: Orders, Medications

## 2014-03-01 NOTE — L&D Delivery Note (Signed)
Delivery Note At 2:06 AM a viable and healthy female was delivered via  (Presentation: LOA ).  APGAR: 9, 9; weight pending .   Placenta status: spontaneous, intact.  Cord: none with the following complications: loose Enderlin reduced, true knot.  Cord pH: na  Anesthesia:  Epidural Episiotomy:  none Lacerations:  Left vulvar Suture Repair: 3.0 vicryl rapide Est. Blood Loss (mL):  100  Mom to postpartum.  Baby to Couplet care / Skin to Skin.  Londa Mackowski J 10/23/2014, 2:17 AM

## 2014-04-08 LAB — OB RESULTS CONSOLE RPR: RPR: NONREACTIVE

## 2014-04-08 LAB — OB RESULTS CONSOLE HIV ANTIBODY (ROUTINE TESTING): HIV: NONREACTIVE

## 2014-04-10 LAB — OB RESULTS CONSOLE GC/CHLAMYDIA
CHLAMYDIA, DNA PROBE: NEGATIVE
GC PROBE AMP, GENITAL: NEGATIVE

## 2014-10-01 LAB — OB RESULTS CONSOLE GBS: STREP GROUP B AG: POSITIVE

## 2014-10-05 ENCOUNTER — Encounter (HOSPITAL_COMMUNITY): Payer: Self-pay | Admitting: *Deleted

## 2014-10-05 ENCOUNTER — Inpatient Hospital Stay (HOSPITAL_COMMUNITY)
Admission: AD | Admit: 2014-10-05 | Discharge: 2014-10-05 | Disposition: A | Discharge status: Home / Self Care | Payer: 59 | Source: Ambulatory Visit | Attending: Obstetrics and Gynecology | Admitting: Obstetrics and Gynecology

## 2014-10-05 DIAGNOSIS — O133 Gestational [pregnancy-induced] hypertension without significant proteinuria, third trimester: Secondary | ICD-10-CM | POA: Insufficient documentation

## 2014-10-05 DIAGNOSIS — O1203 Gestational edema, third trimester: Secondary | ICD-10-CM | POA: Diagnosis present

## 2014-10-05 DIAGNOSIS — O163 Unspecified maternal hypertension, third trimester: Secondary | ICD-10-CM

## 2014-10-05 DIAGNOSIS — Z3A35 35 weeks gestation of pregnancy: Secondary | ICD-10-CM | POA: Diagnosis not present

## 2014-10-05 HISTORY — DX: Unspecified maternal hypertension, third trimester: O16.3

## 2014-10-05 LAB — URINALYSIS, ROUTINE W REFLEX MICROSCOPIC
Bilirubin Urine: NEGATIVE
GLUCOSE, UA: NEGATIVE mg/dL
Hgb urine dipstick: NEGATIVE
KETONES UR: NEGATIVE mg/dL
Leukocytes, UA: NEGATIVE
Nitrite: NEGATIVE
PROTEIN: NEGATIVE mg/dL
Urobilinogen, UA: 0.2 mg/dL (ref 0.0–1.0)
pH: 6 (ref 5.0–8.0)

## 2014-10-05 LAB — COMPREHENSIVE METABOLIC PANEL
ALT: 15 U/L (ref 14–54)
ANION GAP: 9 (ref 5–15)
AST: 23 U/L (ref 15–41)
Albumin: 2.8 g/dL — ABNORMAL LOW (ref 3.5–5.0)
Alkaline Phosphatase: 129 U/L — ABNORMAL HIGH (ref 38–126)
BILIRUBIN TOTAL: 0.8 mg/dL (ref 0.3–1.2)
BUN: 9 mg/dL (ref 6–20)
CALCIUM: 9 mg/dL (ref 8.9–10.3)
CHLORIDE: 107 mmol/L (ref 101–111)
CO2: 22 mmol/L (ref 22–32)
CREATININE: 0.5 mg/dL (ref 0.44–1.00)
Glucose, Bld: 107 mg/dL — ABNORMAL HIGH (ref 65–99)
Potassium: 3.7 mmol/L (ref 3.5–5.1)
Sodium: 138 mmol/L (ref 135–145)
Total Protein: 5.9 g/dL — ABNORMAL LOW (ref 6.5–8.1)

## 2014-10-05 LAB — CBC
HEMATOCRIT: 35.1 % — AB (ref 36.0–46.0)
HEMOGLOBIN: 12.1 g/dL (ref 12.0–15.0)
MCH: 30.3 pg (ref 26.0–34.0)
MCHC: 34.5 g/dL (ref 30.0–36.0)
MCV: 87.8 fL (ref 78.0–100.0)
PLATELETS: 139 10*3/uL — AB (ref 150–400)
RBC: 4 MIL/uL (ref 3.87–5.11)
RDW: 12.8 % (ref 11.5–15.5)
WBC: 9.3 10*3/uL (ref 4.0–10.5)

## 2014-10-05 LAB — URIC ACID: URIC ACID, SERUM: 5.8 mg/dL (ref 2.3–6.6)

## 2014-10-05 NOTE — MAU Provider Note (Signed)
History     CSN: [id number]  Arrival date and time: 10/05/14 3267  Provider on unit: 0935 Provider at bedside: [place]      Chief Complaint  Patient presents with  . Hypertension   HPI  Ms. Jariana Shumard is 24 yo G2P1001 female at 35.[redacted] wks gestation sent to MAU for PIH evaluation.  She reports swelling in BLE and hands.  She works in the ER as an Therapist, sports  Past Medical History  Diagnosis Date  . SVT (supraventricular tachycardia) 11/2009  . SVT (supraventricular tachycardia) 11/2009  . GERD (gastroesophageal reflux disease)   . Urinary tract infection, site not specified   . Elevated blood pressure affecting pregnancy in third trimester, antepartum 10/05/2014    Past Surgical History  Procedure Laterality Date  . Shoulder arthroscopy  12/2005  . Adenoidectomy      Family History  Problem Relation Age of Onset  . Hypertension Paternal Grandmother     History  Substance Use Topics  . Smoking status: Never Smoker   . Smokeless tobacco: Not on file  . Alcohol Use: No    Allergies:  Allergies  Allergen Reactions  . Sulfa Antibiotics Hives and Rash    Prescriptions prior to admission  Medication Sig Dispense Refill Last Dose  . nitrofurantoin, macrocrystal-monohydrate, (MACROBID) 100 MG capsule Take 1 capsule (100 mg total) by mouth 2 (two) times daily. (Patient not taking: Reported on 10/05/2014) 10 capsule 0 Completed Course at Unknown time  . phenazopyridine (PYRIDIUM) 200 MG tablet Take 1 tablet (200 mg total) by mouth 3 (three) times daily as needed for pain. (Patient not taking: Reported on 10/05/2014) 6 tablet 0 Completed Course at Unknown time  . Prenatal Vit-Min-FA-Fish Oil ([facility] PRENATAL GUMMY PO) Take 2 tablets by mouth daily.   Past Week at Unknown time    Review of Systems  Constitutional: Negative.   HENT: Negative.   Eyes: Negative.   Respiratory: Negative.   Cardiovascular: Positive for leg swelling.       Elevated BP's at work 170/90 & 160s/90s   Gastrointestinal: Negative.   Genitourinary: Negative.        No ctxs  Musculoskeletal: Negative.   Skin: Negative.   Neurological: Negative.   Endo/Heme/Allergies: Negative.   Psychiatric/Behavioral: Negative.    Results for orders placed or performed during the hospital encounter of 10/05/14 (from the past 24 hour(s))  Urinalysis, Routine w reflex microscopic (not at [facility])     Status: Abnormal   Collection Time: 10/05/14  8:10 AM  Result Value Ref Range   Color, Urine YELLOW YELLOW   APPearance CLEAR CLEAR   Specific Gravity, Urine >1.030 (H) 1.005 - 1.030   pH 6.0 5.0 - 8.0   Glucose, UA NEGATIVE NEGATIVE mg/dL   Hgb urine dipstick NEGATIVE NEGATIVE   Bilirubin Urine NEGATIVE NEGATIVE   Ketones, ur NEGATIVE NEGATIVE mg/dL   Protein, ur NEGATIVE NEGATIVE mg/dL   Urobilinogen, UA 0.2 0.0 - 1.0 mg/dL   Nitrite NEGATIVE NEGATIVE   Leukocytes, UA NEGATIVE NEGATIVE  CBC     Status: Abnormal   Collection Time: 10/05/14  9:17 AM  Result Value Ref Range   WBC 9.3 4.0 - 10.5 K/uL   RBC 4.00 3.87 - 5.11 MIL/uL   Hemoglobin 12.1 12.0 - 15.0 g/dL   HCT 35.1 (L) 36.0 - 46.0 %   MCV 87.8 78.0 - 100.0 fL   MCH 30.3 26.0 - 34.0 pg   MCHC 34.5 30.0 - 36.0 g/dL   RDW 12.8  11.5 - 15.5 %   Platelets 139 (L) 150 - 400 K/uL  Comprehensive metabolic panel     Status: Abnormal   Collection Time: 10/05/14  9:17 AM  Result Value Ref Range   Sodium 138 135 - 145 mmol/L   Potassium 3.7 3.5 - 5.1 mmol/L   Chloride 107 101 - 111 mmol/L   CO2 22 22 - 32 mmol/L   Glucose, Bld 107 (H) 65 - 99 mg/dL   BUN 9 6 - 20 mg/dL   Creatinine, Ser 0.50 0.44 - 1.00 mg/dL   Calcium 9.0 8.9 - 10.3 mg/dL   Total Protein 5.9 (L) 6.5 - 8.1 g/dL   Albumin 2.8 (L) 3.5 - 5.0 g/dL   AST 23 15 - 41 U/L   ALT 15 14 - 54 U/L   Alkaline Phosphatase 129 (H) 38 - 126 U/L   Total Bilirubin 0.8 0.3 - 1.2 mg/dL   GFR calc non Af Amer >60 >60 mL/min   GFR calc Af Amer >60 >60 mL/min   Anion gap 9 5 - 15  Uric acid      Status: None   Collection Time: 10/05/14  9:17 AM  Result Value Ref Range   Uric Acid, Serum 5.8 2.3 - 6.6 mg/dL   Physical Exam   Blood pressure 130/97, pulse 84, temperature 98.1 F (36.7 C), resp. rate 18, height 5\' 4"  (1.626 m), weight 93.895 kg (207 lb), last menstrual period 01/29/2014. Serial BP range: 130s/80s and 90s to 140s/80s and 90s  Physical Exam  Constitutional: She is oriented to person, place, and time. She appears well-developed and well-nourished.  HENT:  Head: Normocephalic and atraumatic.  Eyes: Pupils are equal, round, and reactive to light.  Neck: Normal range of motion.  Cardiovascular: Normal rate, regular rhythm, normal heart sounds and intact distal pulses.   Respiratory: Effort normal and breath sounds normal.  GI: Soft. Bowel sounds are normal.  Genitourinary:  Pelvic deferred  Musculoskeletal: Normal range of motion.  Neurological: She is alert and oriented to person, place, and time. She has normal reflexes.  Skin: Skin is warm and dry.  Psychiatric: She has a normal mood and affect. Her behavior is normal. Judgment and thought content normal.    MAU Course  Procedures PIH Labs CCUA CEFM Assessment and Plan  24 yo G2P1001 at 35.[redacted] wks gestation Gestational Hypertension Peripheral Edema  Discharge Home [facility] lab precautions PTL precautions OOW note given F/U appt with Dr. Ronita Hipps Monday afternoon  *Dr. Ronita Hipps notified of assessment and plan - agrees   Graceann Congress MSN, CNM 10/05/2014, 9:45 AM

## 2014-10-05 NOTE — Discharge Instructions (Signed)
Hypertension During Pregnancy Hypertension is also called high blood pressure. Blood pressure moves blood in your body. Sometimes, the force that moves the blood becomes too strong. When you are pregnant, this condition should be watched carefully. It can cause problems for you and your baby. HOME CARE   Make and keep all of your doctor visits.  Take medicine as told by your doctor. Tell your doctor about all medicines you take.  Eat very little salt.  Exercise regularly.  Do not drink alcohol.  Do not smoke.  Do not have drinks with caffeine.  Lie on your left side when resting.  Your health care provider may ask you to take one low-dose aspirin (81mg ) each day. GET HELP RIGHT AWAY IF:  You have bad belly (abdominal) pain.  You have sudden puffiness (swelling) in the hands, ankles, or face.  You gain 4 pounds (1.8 kilograms) or more in 1 week.  You throw up (vomit) repeatedly.  You have bleeding from the vagina.  You do not feel the baby moving as much.  You have a headache.  You have blurred or double vision.  You have muscle twitching or spasms.  You have shortness of breath.  You have blue fingernails and lips.  You have blood in your pee (urine). MAKE SURE YOU:  Understand these instructions.  Will watch your condition.  Will get help right away if you are not doing well or get worse. Document Released: 03/20/2010 Document Revised: 07/02/2013 Document Reviewed: 09/14/2012 [facility] Patient Information 2015 [place], [place]. This information is not intended to replace advice given to you by your health care provider. Make sure you discuss any questions you have with your health care provider.  Peripheral Edema You have swelling in your legs (peripheral edema). This swelling is due to excess accumulation of salt and water in your body. Edema may be a sign of heart, kidney or liver disease, or a side effect of a medication. It may also be due to problems in  the leg veins. Elevating your legs and using special support stockings may be very helpful, if the cause of the swelling is due to poor venous circulation. Avoid long periods of standing, whatever the cause. Treatment of edema depends on identifying the cause. Chips, pretzels, pickles and other salty foods should be avoided. Restricting salt in your diet is almost always needed. Water pills (diuretics) are often used to remove the excess salt and water from your body via urine. These medicines prevent the kidney from reabsorbing sodium. This increases urine flow. Diuretic treatment may also result in lowering of potassium levels in your body. Potassium supplements may be needed if you have to use diuretics daily. Daily weights can help you keep track of your progress in clearing your edema. You should call your caregiver for follow up care as recommended. SEEK IMMEDIATE MEDICAL CARE IF:   You have increased swelling, pain, redness, or heat in your legs.  You develop shortness of breath, especially when lying down.  You develop chest or abdominal pain, weakness, or fainting.  You have a fever. Document Released: 03/25/2004 Document Revised: 05/10/2011 Document Reviewed: 03/05/2009 [facility] Patient Information 2015 [place], [place]. This information is not intended to replace advice given to you by your health care provider. Make sure you discuss any questions you have with your health care provider.  Preeclampsia and Eclampsia Preeclampsia is a serious condition that develops only during pregnancy. It is also called toxemia of pregnancy. This condition causes high blood pressure  along with other symptoms, such as swelling and headaches. These may develop as the condition gets worse. Preeclampsia may occur 20 weeks or later into your pregnancy.  Diagnosing and treating preeclampsia early is very important. If not treated early, it can cause serious problems for you and your baby. One problem it can  lead to is eclampsia, which is a condition that causes muscle jerking or shaking (convulsions) in the mother. Delivering your baby is the best treatment for preeclampsia or eclampsia.  RISK FACTORS The cause of preeclampsia is not known. You may be more likely to develop preeclampsia if you have certain risk factors. These include:   Being pregnant for the first time.  Having preeclampsia in a past pregnancy.  Having a family history of preeclampsia.  Having high blood pressure.  Being pregnant with twins or triplets.  Being 85 or older.  Being African American.  Having kidney disease or diabetes.  Having medical conditions such as lupus or blood diseases.  Being very overweight (obese). SIGNS AND SYMPTOMS  The earliest signs of preeclampsia are:  High blood pressure.  Increased protein in your urine. Your health care provider will check for this at every prenatal visit. Other symptoms that can develop include:   Severe headaches.  Sudden weight gain.  Swelling of your hands, face, legs, and feet.  Feeling sick to your stomach (nauseous) and throwing up (vomiting).  Vision problems (blurred or double vision).  Numbness in your face, arms, legs, and feet.  Dizziness.  Slurred speech.  Sensitivity to bright lights.  Abdominal pain. DIAGNOSIS  There are no screening tests for preeclampsia. Your health care provider will ask you about symptoms and check for signs of preeclampsia during your prenatal visits. You may also have tests, including:  Urine testing.  Blood testing.  Checking your baby's heart rate.  Checking the health of your baby and your placenta using images created with sound waves (ultrasound). TREATMENT  You can work out the best treatment approach together with your health care provider. It is very important to keep all prenatal appointments. If you have an increased risk of preeclampsia, you may need more frequent prenatal exams.  Your  health care provider may prescribe bed rest.  You may have to eat as little salt as possible.  You may need to take medicine to lower your blood pressure if the condition does not respond to more conservative measures.  You may need to stay in the hospital if your condition is severe. There, treatment will focus on controlling your blood pressure and fluid retention. You may also need to take medicine to prevent seizures.  If the condition gets worse, your baby may need to be delivered early to protect you and the baby. You may have your labor started with medicine (be induced), or you may have a cesarean delivery.  Preeclampsia usually goes away after the baby is born. HOME CARE INSTRUCTIONS   Only take over-the-counter or prescription medicines as directed by your health care provider.  Lie on your left side while resting. This keeps pressure off your baby.  Elevate your feet while resting.  Get regular exercise. Ask your health care provider what type of exercise is safe for you.  Avoid caffeine and alcohol.  Do not smoke.  Drink 6-8 glasses of water every day.  Eat a balanced diet that is low in salt. Do not add salt to your food.  Avoid stressful situations as much as possible.  Get plenty of rest  and sleep.  Keep all prenatal appointments and tests as scheduled. SEEK MEDICAL CARE IF:  You are gaining more weight than expected.  You have any headaches, abdominal pain, or nausea.  You are bruising more than usual.  You feel dizzy or light-headed. SEEK IMMEDIATE MEDICAL CARE IF:   You develop sudden or severe swelling anywhere in your body. This usually happens in the legs.  You gain 5 lb (2.3 kg) or more in a week.  You have a severe headache, dizziness, problems with your vision, or confusion.  You have severe abdominal pain.  You have lasting nausea or vomiting.  You have a seizure.  You have trouble moving any part of your body.  You develop numbness  in your body.  You have trouble speaking.  You have any abnormal bleeding.  You develop a stiff neck.  You pass out. MAKE SURE YOU:   Understand these instructions.  Will watch your condition.  Will get help right away if you are not doing well or get worse. Document Released: 02/13/2000 Document Revised: 02/20/2013 Document Reviewed: 12/08/2012 [facility] Patient Information 2015 [place], [place]. This information is not intended to replace advice given to you by your health care provider. Make sure you discuss any questions you have with your health care provider.

## 2014-10-05 NOTE — MAU Note (Signed)
Pt presents to MAU with complaints of increase in blood pressure. 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

## 2014-10-22 ENCOUNTER — Encounter (HOSPITAL_COMMUNITY): Payer: Self-pay | Admitting: *Deleted

## 2014-10-22 ENCOUNTER — Inpatient Hospital Stay (HOSPITAL_COMMUNITY): Payer: 59 | Admitting: Anesthesiology

## 2014-10-22 ENCOUNTER — Inpatient Hospital Stay (HOSPITAL_COMMUNITY)
Admission: AD | Admit: 2014-10-22 | Discharge: 2014-10-25 | DRG: 774 | Disposition: A | Discharge status: Home / Self Care | Payer: 59 | Source: Ambulatory Visit | Attending: Obstetrics and Gynecology | Admitting: Obstetrics and Gynecology

## 2014-10-22 DIAGNOSIS — Z87440 Personal history of urinary (tract) infections: Secondary | ICD-10-CM

## 2014-10-22 DIAGNOSIS — Z6836 Body mass index (BMI) 36.0-36.9, adult: Secondary | ICD-10-CM | POA: Diagnosis not present

## 2014-10-22 DIAGNOSIS — O9962 Diseases of the digestive system complicating childbirth: Secondary | ICD-10-CM | POA: Diagnosis present

## 2014-10-22 DIAGNOSIS — O99824 Streptococcus B carrier state complicating childbirth: Secondary | ICD-10-CM | POA: Diagnosis present

## 2014-10-22 DIAGNOSIS — O163 Unspecified maternal hypertension, third trimester: Secondary | ICD-10-CM

## 2014-10-22 DIAGNOSIS — Z8249 Family history of ischemic heart disease and other diseases of the circulatory system: Secondary | ICD-10-CM

## 2014-10-22 DIAGNOSIS — K219 Gastro-esophageal reflux disease without esophagitis: Secondary | ICD-10-CM | POA: Diagnosis present

## 2014-10-22 DIAGNOSIS — O1493 Unspecified pre-eclampsia, third trimester: Secondary | ICD-10-CM | POA: Diagnosis present

## 2014-10-22 DIAGNOSIS — O113 Pre-existing hypertension with pre-eclampsia, third trimester: Secondary | ICD-10-CM | POA: Diagnosis present

## 2014-10-22 DIAGNOSIS — O133 Gestational [pregnancy-induced] hypertension without significant proteinuria, third trimester: Principal | ICD-10-CM | POA: Diagnosis present

## 2014-10-22 DIAGNOSIS — E669 Obesity, unspecified: Secondary | ICD-10-CM | POA: Diagnosis present

## 2014-10-22 DIAGNOSIS — O99214 Obesity complicating childbirth: Secondary | ICD-10-CM | POA: Diagnosis present

## 2014-10-22 DIAGNOSIS — Z3490 Encounter for supervision of normal pregnancy, unspecified, unspecified trimester: Secondary | ICD-10-CM

## 2014-10-22 LAB — COMPREHENSIVE METABOLIC PANEL
ALBUMIN: 2.9 g/dL — AB (ref 3.5–5.0)
ALK PHOS: 143 U/L — AB (ref 38–126)
ALT: 21 U/L (ref 14–54)
AST: 27 U/L (ref 15–41)
Anion gap: 9 (ref 5–15)
BUN: 18 mg/dL (ref 6–20)
CALCIUM: 9.3 mg/dL (ref 8.9–10.3)
CO2: 23 mmol/L (ref 22–32)
CREATININE: 0.59 mg/dL (ref 0.44–1.00)
Chloride: 104 mmol/L (ref 101–111)
GFR calc Af Amer: >60 mL/min (ref >60)
GFR calc non Af Amer: >60 mL/min (ref >60)
GLUCOSE: 94 mg/dL (ref 65–99)
Potassium: 4.5 mmol/L (ref 3.5–5.1)
SODIUM: 136 mmol/L (ref 135–145)
Total Bilirubin: 1.1 mg/dL (ref 0.3–1.2)
Total Protein: 5.6 g/dL — ABNORMAL LOW (ref 6.5–8.1)

## 2014-10-22 LAB — CBC
HEMATOCRIT: 35.4 % — AB (ref 36.0–46.0)
HEMOGLOBIN: 12.2 g/dL (ref 12.0–15.0)
MCH: 30.6 pg (ref 26.0–34.0)
MCHC: 34.5 g/dL (ref 30.0–36.0)
MCV: 88.7 fL (ref 78.0–100.0)
Platelets: 141 10*3/uL — ABNORMAL LOW (ref 150–400)
RBC: 3.99 MIL/uL (ref 3.87–5.11)
RDW: 13.2 % (ref 11.5–15.5)
WBC: 10.4 10*3/uL (ref 4.0–10.5)

## 2014-10-22 LAB — TYPE AND SCREEN
ABO/RH(D): A POS
Antibody Screen: NEGATIVE

## 2014-10-22 LAB — LACTATE DEHYDROGENASE: LDH: 233 U/L — ABNORMAL HIGH (ref 98–192)

## 2014-10-22 LAB — ABO/RH: ABO/RH(D): A POS

## 2014-10-22 LAB — URIC ACID: Uric Acid, Serum: 6.5 mg/dL (ref 2.3–6.6)

## 2014-10-22 MED ORDER — LABETALOL HCL 100 MG PO TABS
100.0000 mg | ORAL_TABLET | Freq: Three times a day (TID) | ORAL | Status: DC
  Administered 2014-10-22 – 2014-10-23 (×2): 100 mg via ORAL
  Filled 2014-10-22 (×4): qty 1

## 2014-10-22 MED ORDER — DIPHENHYDRAMINE HCL 50 MG/ML IJ SOLN: 12.5000 mg | INTRAMUSCULAR | Status: DC | PRN

## 2014-10-22 MED ORDER — LACTATED RINGERS IV SOLN
500.0000 mL | INTRAVENOUS | Status: DC | PRN
  Administered 2014-10-22: 1000 mL via INTRAVENOUS

## 2014-10-22 MED ORDER — EPHEDRINE 5 MG/ML INJ: 10.0000 mg | INTRAVENOUS | Status: DC | PRN

## 2014-10-22 MED ORDER — LIDOCAINE HCL (PF) 1 % IJ SOLN
30.0000 mL | INTRAMUSCULAR | Status: DC | PRN
  Filled 2014-10-22: qty 30

## 2014-10-22 MED ORDER — TERBUTALINE SULFATE 1 MG/ML IJ SOLN
0.2500 mg | Freq: Once | INTRAMUSCULAR | Status: DC | PRN
  Filled 2014-10-22: qty 1

## 2014-10-22 MED ORDER — FENTANYL 2.5 MCG/ML BUPIVACAINE 1/10 % EPIDURAL INFUSION (WH - ANES): 14.0000 mL/h | INTRAMUSCULAR | Status: DC | PRN

## 2014-10-22 MED ORDER — SODIUM CHLORIDE 0.9 % IV SOLN
2.0000 g | Freq: Once | INTRAVENOUS | Status: AC
  Administered 2014-10-22: 2 g via INTRAVENOUS
  Filled 2014-10-22: qty 2000

## 2014-10-22 MED ORDER — OXYCODONE-ACETAMINOPHEN 5-325 MG PO TABS: 1.0000 | ORAL_TABLET | ORAL | Status: DC | PRN

## 2014-10-22 MED ORDER — LIDOCAINE HCL (PF) 1 % IJ SOLN
INTRAMUSCULAR | Status: DC | PRN
  Administered 2014-10-22 (×2): 8 mL via EPIDURAL

## 2014-10-22 MED ORDER — CITRIC ACID-SODIUM CITRATE 334-500 MG/5ML PO SOLN
30.0000 mL | ORAL | Status: DC | PRN
  Administered 2014-10-23: 30 mL via ORAL
  Filled 2014-10-22: qty 15

## 2014-10-22 MED ORDER — PHENYLEPHRINE 40 MCG/ML (10ML) SYRINGE FOR IV PUSH (FOR BLOOD PRESSURE SUPPORT): 80.0000 ug | PREFILLED_SYRINGE | INTRAVENOUS | Status: DC | PRN

## 2014-10-22 MED ORDER — PHENYLEPHRINE 40 MCG/ML (10ML) SYRINGE FOR IV PUSH (FOR BLOOD PRESSURE SUPPORT)
80.0000 ug | PREFILLED_SYRINGE | INTRAVENOUS | Status: DC | PRN
  Filled 2014-10-22: qty 20
  Filled 2014-10-22: qty 2

## 2014-10-22 MED ORDER — ONDANSETRON HCL 4 MG/2ML IJ SOLN
4.0000 mg | Freq: Four times a day (QID) | INTRAMUSCULAR | Status: DC | PRN
  Administered 2014-10-22: 4 mg via INTRAVENOUS
  Filled 2014-10-22: qty 2

## 2014-10-22 MED ORDER — LACTATED RINGERS IV SOLN
INTRAVENOUS | Status: DC
  Administered 2014-10-22 (×2): via INTRAVENOUS

## 2014-10-22 MED ORDER — OXYTOCIN 40 UNITS IN LACTATED RINGERS INFUSION - SIMPLE MED
1.0000 m[IU]/min | INTRAVENOUS | Status: DC
  Administered 2014-10-22: 2 m[IU]/min via INTRAVENOUS
  Filled 2014-10-22: qty 1000

## 2014-10-22 MED ORDER — FENTANYL 2.5 MCG/ML BUPIVACAINE 1/10 % EPIDURAL INFUSION (WH - ANES)
14.0000 mL/h | INTRAMUSCULAR | Status: DC | PRN
  Administered 2014-10-22: 14 mL/h via EPIDURAL
  Filled 2014-10-22: qty 125

## 2014-10-22 MED ORDER — ACETAMINOPHEN 325 MG PO TABS: 650.0000 mg | ORAL_TABLET | ORAL | Status: DC | PRN

## 2014-10-22 MED ORDER — EPHEDRINE 5 MG/ML INJ
10.0000 mg | INTRAVENOUS | Status: DC | PRN
  Filled 2014-10-22: qty 2

## 2014-10-22 MED ORDER — FLEET ENEMA 7-19 GM/118ML RE ENEM: 1.0000 | ENEMA | RECTAL | Status: DC | PRN

## 2014-10-22 MED ORDER — OXYCODONE-ACETAMINOPHEN 5-325 MG PO TABS: 2.0000 | ORAL_TABLET | ORAL | Status: DC | PRN

## 2014-10-22 MED ORDER — OXYTOCIN BOLUS FROM INFUSION
500.0000 mL | INTRAVENOUS | Status: DC
  Administered 2014-10-23: 500 mL via INTRAVENOUS

## 2014-10-22 MED ORDER — OXYTOCIN 40 UNITS IN LACTATED RINGERS INFUSION - SIMPLE MED: 62.5000 mL/h | INTRAVENOUS | Status: DC

## 2014-10-22 NOTE — H&P (Signed)
Amanda Barker is a 24 y.o. female presenting for IOL for mild PEC.  Maternal Medical History:  Contractions: Onset was less than 1 hour ago.   Frequency: irregular.   Perceived severity is mild.    Fetal activity: Perceived fetal activity is normal.   Last perceived fetal movement was within the past hour.    Prenatal complications: PIH and pre-eclampsia.   Prenatal Complications - Diabetes: none.    OB History    Gravida Para Term Preterm AB TAB SAB Ectopic Multiple Living   2 1 1  0 0 0 0 0 0 1     Past Medical History  Diagnosis Date  . SVT (supraventricular tachycardia) 11/2009  . SVT (supraventricular tachycardia) 11/2009  . GERD (gastroesophageal reflux disease)   . Urinary tract infection, site not specified   . Elevated blood pressure affecting pregnancy in third trimester, antepartum 10/05/2014   Past Surgical History  Procedure Laterality Date  . Shoulder arthroscopy  12/2005  . Adenoidectomy     Family History: family history includes Hypertension in her paternal grandmother. Social History:  reports that she has never smoked. She does not have any smokeless tobacco history on file. She reports that she does not drink alcohol or use illicit drugs.   Prenatal Transfer Tool  Maternal Diabetes: No Genetic Screening: Normal Maternal Ultrasounds/Referrals: Normal Fetal Ultrasounds or other Referrals:  None Maternal Substance Abuse:  No Significant Maternal Medications:  None Significant Maternal Lab Results:  None Other Comments:  None  Review of Systems  Constitutional: Negative.   Eyes: Negative.   Gastrointestinal: Negative.   Genitourinary: Negative.   Skin: Negative.   All other systems reviewed and are negative.   Dilation: 2 Effacement (%): 50 Station: -2 Exam by:: Sultan Pargas Blood pressure 143/87, pulse 89, temperature 98 F (36.7 C), temperature source Oral, resp. rate 18, last menstrual period 01/29/2014. Maternal Exam:  Uterine  Assessment: Contraction strength is mild.  Contraction frequency is irregular.   Abdomen: Patient reports no abdominal tenderness. Fetal presentation: vertex  Introitus: Normal vulva. Normal vagina.  Ferning test: not done.  Nitrazine test: not done. Amniotic fluid character: not assessed.  Pelvis: adequate for delivery.   Cervix: Cervix evaluated by digital exam.     Physical Exam  Constitutional: She is oriented to person, place, and time. She appears well-developed and well-nourished.  Neck: Normal range of motion. Neck supple.  Cardiovascular: Normal rate and regular rhythm.   Respiratory: Effort normal and breath sounds normal.  GI: Soft. Bowel sounds are normal.  Genitourinary: Vagina normal and uterus normal.  Musculoskeletal: Normal range of motion.  Neurological: She is alert and oriented to person, place, and time. She has normal reflexes.  Skin: Skin is warm and dry.  Psychiatric: She has a normal mood and affect.    Prenatal labs: ABO, Rh:   Antibody:   Rubella:   RPR:    HBsAg:    HIV:    GBS:     Assessment/Plan: Mild PEC 38 weeks GBS positive Admit, ck labs, Pitocin, IV ABX Monitor s/s PEC   Amanda Barker J 10/22/2014, 6:16 PM

## 2014-10-22 NOTE — Progress Notes (Signed)
Amanda Barker is a 24 y.o. G2P1001 at [redacted]w[redacted]d by LMP admitted for induction of labor due to Hypertension.  Subjective: No headaches or visual changes. No epigastric pain.  Objective: BP 166/95 mmHg  Pulse 176  Temp(Src) 98 F (36.7 C) (Oral)  Resp 18  Ht 5\' 4"  (1.626 m)  Wt 97.523 kg (215 lb)  BMI 36.89 kg/m2  LMP 01/29/2014      FHT:  FHR: 145 bpm, variability: moderate,  accelerations:  Present,  decelerations:  Absent UC:   irregular, every 5-7 minutes SVE:   Dilation: 2 Effacement (%): 50 Station: -2 Exam by:: Sissy Goetzke  Labs: Lab Results  Component Value Date   WBC 10.4 10/22/2014   HGB 12.2 10/22/2014   HCT 35.4* 10/22/2014   MCV 88.7 10/22/2014   PLT 141* 10/22/2014    Assessment / Plan: Induction of labor due to preeclampsia,  progressing well on pitocin  Labor: Progressing normally Preeclampsia:  no signs or symptoms of toxicity, intake and ouput balanced and labs stable Fetal Wellbeing:  Category I Pain Control:  Labor support without medications I/D:  n/a Anticipated MOD:  NSVD  Amanda Barker 10/22/2014, 9:14 PM

## 2014-10-22 NOTE — Anesthesia Preprocedure Evaluation (Signed)
Anesthesia Evaluation  Patient identified by MRN, date of birth, ID band Patient awake    Reviewed: Allergy & Precautions, H&P , NPO status , Patient's Chart, lab work & pertinent test results  Airway Mallampati: II  TM Distance: >3 FB Neck ROM: full    Dental no notable dental hx.    Pulmonary neg pulmonary ROS,    Pulmonary exam normal       Cardiovascular hypertension, Pt. on home beta blockers Normal cardiovascular exam    Neuro/Psych negative neurological ROS  negative psych ROS   GI/Hepatic Neg liver ROS,   Endo/Other  negative endocrine ROS  Renal/GU negative Renal ROS     Musculoskeletal   Abdominal (+) + obese,   Peds  Hematology negative hematology ROS (+)   Anesthesia Other Findings   Reproductive/Obstetrics (+) Pregnancy                             Anesthesia Physical Anesthesia Plan  ASA: III  Anesthesia Plan: Epidural   Post-op Pain Management:    Induction:   Airway Management Planned:   Additional Equipment:   Intra-op Plan:   Post-operative Plan:   Informed Consent: I have reviewed the patients History and Physical, chart, labs and discussed the procedure including the risks, benefits and alternatives for the proposed anesthesia with the patient or authorized representative who has indicated his/her understanding and acceptance.     Plan Discussed with:   Anesthesia Plan Comments:         Anesthesia Quick Evaluation

## 2014-10-22 NOTE — Anesthesia Procedure Notes (Signed)
Epidural Patient location during procedure: OB Start time: 10/22/2014 10:09 PM End time: 10/22/2014 10:12 PM  Staffing Anesthesiologist: Lyn Hollingshead Performed by: anesthesiologist   Preanesthetic Checklist Completed: patient identified, surgical consent, pre-op evaluation, timeout performed, IV checked, risks and benefits discussed and monitors and equipment checked  Epidural Patient position: sitting Prep: site prepped and draped and DuraPrep Patient monitoring: continuous pulse ox and blood pressure Approach: midline Location: L3-L4 Injection technique: LOR air  Needle:  Needle type: Tuohy  Needle gauge: 17 G Needle length: 9 cm and 9 Needle insertion depth: 6 cm Catheter type: closed end flexible Catheter size: 19 Gauge Catheter at skin depth: 11 cm Test dose: negative and Other  Assessment Sensory level: T9 Events: blood not aspirated, injection not painful, no injection resistance, negative IV test and no paresthesia  Additional Notes Reason for block:procedure for pain

## 2014-10-23 ENCOUNTER — Encounter (HOSPITAL_COMMUNITY): Payer: Self-pay

## 2014-10-23 LAB — CBC
HCT: 33.2 % — ABNORMAL LOW (ref 36.0–46.0)
HCT: 33.1 % — ABNORMAL LOW (ref 36.0–46.0)
HEMOGLOBIN: 11.3 g/dL — AB (ref 12.0–15.0)
Hemoglobin: 11.4 g/dL — ABNORMAL LOW (ref 12.0–15.0)
MCH: 30.2 pg (ref 26.0–34.0)
MCH: 30.2 pg (ref 26.0–34.0)
MCHC: 34.3 g/dL (ref 30.0–36.0)
MCHC: 34.1 g/dL (ref 30.0–36.0)
MCV: 88.1 fL (ref 78.0–100.0)
MCV: 88.5 fL (ref 78.0–100.0)
PLATELETS: 124 10*3/uL — AB (ref 150–400)
PLATELETS: 111 10*3/uL — AB (ref 150–400)
RBC: 3.77 MIL/uL — ABNORMAL LOW (ref 3.87–5.11)
RBC: 3.74 MIL/uL — ABNORMAL LOW (ref 3.87–5.11)
RDW: 13.3 % (ref 11.5–15.5)
RDW: 13.3 % (ref 11.5–15.5)
WBC: 16.6 10*3/uL — AB (ref 4.0–10.5)
WBC: 15.5 10*3/uL — ABNORMAL HIGH (ref 4.0–10.5)

## 2014-10-23 LAB — RPR: RPR: NONREACTIVE

## 2014-10-23 MED ORDER — PRENATAL MULTIVITAMIN CH
1.0000 | ORAL_TABLET | Freq: Every day | ORAL | Status: DC
  Administered 2014-10-23 – 2014-10-25 (×3): 1 via ORAL
  Filled 2014-10-23 (×3): qty 1

## 2014-10-23 MED ORDER — IBUPROFEN 600 MG PO TABS
600.0000 mg | ORAL_TABLET | Freq: Four times a day (QID) | ORAL | Status: DC
  Administered 2014-10-23 – 2014-10-25 (×10): 600 mg via ORAL
  Filled 2014-10-23 (×10): qty 1

## 2014-10-23 MED ORDER — ZOLPIDEM TARTRATE 5 MG PO TABS: 5.0000 mg | ORAL_TABLET | Freq: Every evening | ORAL | Status: DC | PRN

## 2014-10-23 MED ORDER — BENZOCAINE-MENTHOL 20-0.5 % EX AERO
1.0000 "application " | INHALATION_SPRAY | CUTANEOUS | Status: DC | PRN
  Administered 2014-10-23: 1 via TOPICAL
  Filled 2014-10-23: qty 56

## 2014-10-23 MED ORDER — ONDANSETRON HCL 4 MG/2ML IJ SOLN: 4.0000 mg | INTRAMUSCULAR | Status: DC | PRN

## 2014-10-23 MED ORDER — DIPHENHYDRAMINE HCL 25 MG PO CAPS: 25.0000 mg | ORAL_CAPSULE | Freq: Four times a day (QID) | ORAL | Status: DC | PRN

## 2014-10-23 MED ORDER — OXYCODONE-ACETAMINOPHEN 5-325 MG PO TABS
2.0000 | ORAL_TABLET | ORAL | Status: DC | PRN
Start: 2014-10-23 — End: 2014-10-25

## 2014-10-23 MED ORDER — LANOLIN HYDROUS EX OINT: TOPICAL_OINTMENT | CUTANEOUS | Status: DC | PRN

## 2014-10-23 MED ORDER — ACETAMINOPHEN 325 MG PO TABS: 650.0000 mg | ORAL_TABLET | ORAL | Status: DC | PRN

## 2014-10-23 MED ORDER — SENNOSIDES-DOCUSATE SODIUM 8.6-50 MG PO TABS
2.0000 | ORAL_TABLET | ORAL | Status: DC
  Administered 2014-10-23 – 2014-10-24 (×2): 2 via ORAL
  Filled 2014-10-23 (×2): qty 2

## 2014-10-23 MED ORDER — METHYLERGONOVINE MALEATE 0.2 MG PO TABS: 0.2000 mg | ORAL_TABLET | ORAL | Status: DC | PRN

## 2014-10-23 MED ORDER — SIMETHICONE 80 MG PO CHEW: 80.0000 mg | CHEWABLE_TABLET | ORAL | Status: DC | PRN

## 2014-10-23 MED ORDER — ONDANSETRON HCL 4 MG PO TABS: 4.0000 mg | ORAL_TABLET | ORAL | Status: DC | PRN

## 2014-10-23 MED ORDER — METHYLERGONOVINE MALEATE 0.2 MG/ML IJ SOLN: 0.2000 mg | INTRAMUSCULAR | Status: DC | PRN

## 2014-10-23 MED ORDER — DIBUCAINE 1 % RE OINT: 1.0000 "application " | TOPICAL_OINTMENT | RECTAL | Status: DC | PRN

## 2014-10-23 MED ORDER — WITCH HAZEL-GLYCERIN EX PADS: 1.0000 "application " | MEDICATED_PAD | CUTANEOUS | Status: DC | PRN

## 2014-10-23 MED ORDER — TETANUS-DIPHTH-ACELL PERTUSSIS 5-2.5-18.5 LF-MCG/0.5 IM SUSP: 0.5000 mL | Freq: Once | INTRAMUSCULAR | Status: DC

## 2014-10-23 MED ORDER — OXYCODONE-ACETAMINOPHEN 5-325 MG PO TABS: 1.0000 | ORAL_TABLET | ORAL | Status: DC | PRN

## 2014-10-23 NOTE — Lactation Note (Signed)
This note was copied from the chart of Amanda Barker. Lactation Consultation Note  Initial visit made.  Breastfeeding consultation services and support information given to patient.  Baby is 1 hours old and he has latched but shallow per mom.  Baby has also been sleepy.  Reassured and encouraged to watch for feeding cues.  Instructed to feed skin to skin as much as possible.  Mom will call for assist when baby shows interest.  Patient Name: Amanda Barker [id number] Date: 10/23/2014 Reason for consult: Initial assessment   Maternal Data Does the patient have breastfeeding experience prior to this delivery?: Yes  Feeding Feeding Type: Breast Fed Length of feed: 8 min  LATCH Score/Interventions                      Lactation Tools Discussed/Used     Consult Status Consult Status: Follow-up Date: 10/24/14 Follow-up type: In-patient    Ave Filter 10/23/2014, 12:03 PM

## 2014-10-23 NOTE — Anesthesia Postprocedure Evaluation (Signed)
  Anesthesia Post-op Note  Patient: Amanda Barker  Procedure(s) Performed: * No procedures listed *  Patient Location: Mother/Baby  Anesthesia Type:Epidural  Level of Consciousness: awake, alert , oriented and patient cooperative  Airway and Oxygen Therapy: Patient Spontanous Breathing  Post-op Pain: mild  Post-op Assessment: Patient's Cardiovascular Status Stable, Respiratory Function Stable, No headache, No backache and Patient able to bend at knees              Post-op Vital Signs: Reviewed and stable  Last Vitals:  Filed Vitals:   10/23/14 0644  BP: 142/91  Pulse: 78  Temp: 36.7 C  Resp: 18    Complications: No apparent anesthesia complications

## 2014-10-24 LAB — COMPREHENSIVE METABOLIC PANEL
ALT: 22 U/L (ref 14–54)
AST: 37 U/L (ref 15–41)
Albumin: 2.3 g/dL — ABNORMAL LOW (ref 3.5–5.0)
Alkaline Phosphatase: 120 U/L (ref 38–126)
Anion gap: 7 (ref 5–15)
BUN: 17 mg/dL (ref 6–20)
CO2: 27 mmol/L (ref 22–32)
Calcium: 8.9 mg/dL (ref 8.9–10.3)
Chloride: 105 mmol/L (ref 101–111)
Creatinine, Ser: 0.54 mg/dL (ref 0.44–1.00)
GFR calc Af Amer: >60 mL/min (ref >60)
GFR calc non Af Amer: >60 mL/min (ref >60)
Glucose, Bld: 62 mg/dL — ABNORMAL LOW (ref 65–99)
Potassium: 4.1 mmol/L (ref 3.5–5.1)
Sodium: 139 mmol/L (ref 135–145)
Total Bilirubin: 0.8 mg/dL (ref 0.3–1.2)
Total Protein: 4.6 g/dL — ABNORMAL LOW (ref 6.5–8.1)

## 2014-10-24 LAB — CBC
HCT: 31.6 % — ABNORMAL LOW (ref 36.0–46.0)
Hemoglobin: 10.6 g/dL — ABNORMAL LOW (ref 12.0–15.0)
MCH: 30 pg (ref 26.0–34.0)
MCHC: 33.5 g/dL (ref 30.0–36.0)
MCV: 89.5 fL (ref 78.0–100.0)
PLATELETS: 105 10*3/uL — AB (ref 150–400)
RBC: 3.53 MIL/uL — AB (ref 3.87–5.11)
RDW: 13.6 % (ref 11.5–15.5)
WBC: 10.8 10*3/uL — ABNORMAL HIGH (ref 4.0–10.5)

## 2014-10-24 MED ORDER — HYDROCHLOROTHIAZIDE 12.5 MG PO CAPS
12.5000 mg | ORAL_CAPSULE | Freq: Every day | ORAL | Status: DC
  Administered 2014-10-25: 12.5 mg via ORAL
  Filled 2014-10-24 (×2): qty 1

## 2014-10-24 MED ORDER — LABETALOL HCL 100 MG PO TABS
100.0000 mg | ORAL_TABLET | Freq: Three times a day (TID) | ORAL | Status: DC
  Administered 2014-10-24 – 2014-10-25 (×4): 100 mg via ORAL
  Filled 2014-10-24 (×4): qty 1

## 2014-10-24 MED ORDER — HYDROCHLOROTHIAZIDE 25 MG PO TABS
25.0000 mg | ORAL_TABLET | ORAL | Status: AC
  Administered 2014-10-24: 25 mg via ORAL
  Filled 2014-10-24: qty 1

## 2014-10-24 NOTE — Progress Notes (Signed)
Patient ID: Amanda Barker, female   DOB: 31-Jul-1990, 24 y.o.   MRN: [id number] INTERVAL NOTE:  S:   Sitting in bed, feeling well while visiting with family, min cramping, (+) voids, small bleed, denies HA/NV/dizziness, female infant circumcision in transport to nursery for circumcision  O:   VSS, AAO x 3, NAD  1 FB below U  Scant lochia  A / P:   PPD #0  Stable post partum  Routine PP orders  Graceann Congress, MSN, CNM 10/23/2014, 12:28 PM

## 2014-10-24 NOTE — Progress Notes (Signed)
Amanda Barker notified of elevated BP this am. CMET orders already placed. Will continue to monitor. Maxwell Caul, Leretha Dykes Justin

## 2014-10-24 NOTE — Progress Notes (Signed)
PPD 1 SVD / mild PEC  S:  Reports feeling ok - tired / no PIH symptoms / some backache             Tolerating po/ No nausea or vomiting             Bleeding is light             Pain controlled with motrin             Up ad lib / ambulatory / voiding QS  Newborn breast feeding   O:               VS: BP 146/96 mmHg  Pulse 70  Temp(Src) 98.4 F (36.9 C) (Oral)  Resp 16  Ht 5\' 4"  (1.626 m)  Wt 97.523 kg (215 lb)  BMI 36.89 kg/m2  SpO2 98%  LMP 01/29/2014  Breastfeeding? Unknown  BP: 146/96 - 141/97 - 134/80 -142/91 - 143/83   LABS: CMP pending             Recent Labs  10/23/14 0755 10/24/14 0525  WBC 15.5* 10.8*  HGB 11.3* 10.6*  PLT 111* 105*               Blood type: --/--/A POS, A POS (08/23 1710)  Rubella:   Immune             tdap current                    I&O:not done             Physical Exam:             Alert and oriented X3  Lungs: Clear and unlabored  Heart: regular rate and rhythm / no mumurs  Abdomen: soft, non-tender, non-distended              Fundus: firm, non-tender, U-1  Perineum: mild edema  Lochia: light  Extremities: 2+ edema, no calf pain or tenderness  A: PPD # 1             Mild PEC             Dependent edema   Doing well - stable status  P: Routine post partum orders  [place] labs today             Restart labetalol - add HCTZ             Repeat CBC tomorrow (assess platelet prior to DC)  Artelia Laroche CNM, MSN, [facility] 10/24/2014, 8:15 AM

## 2014-10-24 NOTE — Progress Notes (Signed)
Mom asleep   B/P not taken and motrin not given

## 2014-10-25 LAB — CBC
HCT: 34.2 % — ABNORMAL LOW (ref 36.0–46.0)
Hemoglobin: 11.2 g/dL — ABNORMAL LOW (ref 12.0–15.0)
MCH: 29.9 pg (ref 26.0–34.0)
MCHC: 32.7 g/dL (ref 30.0–36.0)
MCV: 91.2 fL (ref 78.0–100.0)
Platelets: 132 10*3/uL — ABNORMAL LOW (ref 150–400)
RBC: 3.75 MIL/uL — ABNORMAL LOW (ref 3.87–5.11)
RDW: 13.9 % (ref 11.5–15.5)
WBC: 11.5 10*3/uL — ABNORMAL HIGH (ref 4.0–10.5)

## 2014-10-25 MED ORDER — IBUPROFEN 600 MG PO TABS: 600.0000 mg | ORAL_TABLET | Freq: Four times a day (QID) | ORAL | Status: DC

## 2014-10-25 MED ORDER — HYDROCHLOROTHIAZIDE 12.5 MG PO CAPS: 12.5000 mg | ORAL_CAPSULE | Freq: Every day | ORAL | Status: DC

## 2014-10-25 NOTE — Discharge Summary (Signed)
Obstetric Discharge Summary Reason for Admission: induction of labor for mild PEC Prenatal Procedures: ultrasound Intrapartum Procedures: spontaneous vaginal delivery Postpartum Procedures: none Complications-Operative and Postpartum: LT labial laceration HEMOGLOBIN  Date Value Ref Range Status  10/25/2014 11.2* 12.0 - 15.0 g/dL Final   HCT  Date Value Ref Range Status  10/25/2014 34.2* 36.0 - 46.0 % Final    Physical Exam:  General: alert, cooperative and no distress Lochia: appropriate Uterine Fundus: firm Labia: healing well, no significant drainage, no dehiscence, no significant erythema DVT Evaluation: No evidence of DVT seen on physical exam. Negative Homan's sign. No cords or calf tenderness. Calf/Ankle 2+ pitting edema is present.  Discharge Diagnoses: Term Pregnancy-delivered  Discharge Information: Date: 10/25/2014 Activity: pelvic rest Diet: routine Medications: PNV, Ibuprofen and HCTZ Condition: stable Instructions: refer to practice specific booklet Discharge to: home Follow-up Information    Follow up with Lovenia Kim, MD. Schedule an appointment as soon as possible for a visit in 1 week.   Specialty:  Obstetrics and Gynecology   Why:  blood pressure recheck   Contact information:   [place] [address] [phone]       Follow up with Lovenia Kim, MD. Schedule an appointment as soon as possible for a visit in 6 weeks.   Specialty:  Obstetrics and Gynecology   Why:  postpartum visit   Contact information:   [place] [address] [phone]       Newborn Data: Live born female on 10/23/2014 Birth Weight: 6 lb 9.8 oz (2999 g) APGAR: 9, 9  Home with mother.  Laury Deep, M MSN, CNM 10/25/2014, 1:28 PM

## 2014-10-25 NOTE — Progress Notes (Signed)
PPD #2 SVD / mild PEC  S:  Reports feeling ok - tired / no PIH symptoms / some backache             Tolerating po/ No nausea or vomiting             Bleeding is light             Pain controlled with motrin             Up ad lib / ambulatory / voiding QS  Newborn breast feeding   O:               VS: BP 132/86 mmHg  Pulse 85  Temp(Src) 98.3 F (36.8 C) (Oral)  Resp 18  Ht 5\' 4"  (1.626 m)  Wt 97.523 kg (215 lb)  BMI 36.89 kg/m2  SpO2 98%  LMP 01/29/2014  Breastfeeding  BP: 146/96 - 141/97 - 134/80 -142/91 - 143/83   LABS: CMP pending              Recent Labs  10/24/14 0525 10/25/14 0535  WBC 10.8* 11.5*  HGB 10.6* 11.2*  PLT 105* 132*               Blood type: --/--/A POS, A POS (08/23 1710)  Rubella:   Immune             tdap current                    I&O:not done             Physical Exam:             Alert and oriented X3  Lungs: Clear and unlabored  Heart: regular rate and rhythm / no mumurs  Abdomen: soft, non-tender, non-distended              Fundus: firm, non-tender, U-3  Perineum: mild edema  Lochia: light  Extremities: 2+ edema, no calf pain or tenderness  A: PPD # 2             Mild PEC             Dependent edema   Platelets improving  Doing well - stable status  P: Routine post partum orders  PIH labs today             Continue labetalol - and HCTZ  D/C home today / BP recheck in 1 wk               Laury Deep, Jerilynn Mages MSN, CNM 10/25/2014, 1:23 PM

## 2014-10-25 NOTE — Discharge Instructions (Signed)
Hypertension During Pregnancy Hypertension is also called high blood pressure. Blood pressure moves blood in your body. Sometimes, the force that moves the blood becomes too strong. When you are pregnant, this condition should be watched carefully. It can cause problems for you and your baby. HOME CARE   Make and keep all of your doctor visits.  Take medicine as told by your doctor. Tell your doctor about all medicines you take.  Eat very little salt.  Exercise regularly.  Do not drink alcohol.  Do not smoke.  Do not have drinks with caffeine.  Lie on your left side when resting.  Your health care provider may ask you to take one low-dose aspirin (81mg ) each day. GET HELP RIGHT AWAY IF:  You have bad belly (abdominal) pain.  You have sudden puffiness (swelling) in the hands, ankles, or face.  You gain 4 pounds (1.8 kilograms) or more in 1 week.  You throw up (vomit) repeatedly.  You have bleeding from the vagina.  You do not feel the baby moving as much.  You have a headache.  You have blurred or double vision.  You have muscle twitching or spasms.  You have shortness of breath.  You have blue fingernails and lips.  You have blood in your pee (urine). MAKE SURE YOU:  Understand these instructions.  Will watch your condition.  Will get help right away if you are not doing well or get worse. Document Released: 03/20/2010 Document Revised: 07/02/2013 Document Reviewed: 09/14/2012 [facility] Patient Information 2015 [place], [place]. This information is not intended to replace advice given to you by your health care provider. Make sure you discuss any questions you have with your health care provider. Nutrition for the New Mother  A new mother needs good health and nutrition so she can have energy to take care of a new baby. Whether a mother breastfeeds or formula feeds the baby, it is important to have a well-balanced diet. Foods from all the food groups should be  chosen to meet the new mother's energy needs and to give her the nutrients needed for repair and healing.  A HEALTHY EATING PLAN The My Pyramid plan for Moms outlines what you should eat to help you and your baby stay healthy. The energy and amount of food you need depends on whether or not you are breastfeeding. If you are breastfeeding you will need more nutrients. If you choose not to breastfeed, your nutrition goal should be to return to a healthy weight. Limiting calories may be needed if you are not breastfeeding.  HOME CARE INSTRUCTIONS   For a personal plan based on your unique needs, see your Registered Dietitian or visit [email or web address]  Eat a variety of foods. The plan below will help guide you. The following chart has a suggested daily meal plan from the My Pyramid for Moms.  Eat a variety of fruits and vegetables.  Eat more dark green and orange vegetables and cooked dried beans.  Make half your grains whole grains. Choose whole instead of refined grains.  Choose low-fat or lean meats and poultry.  Choose low-fat or fat-free dairy products like milk, cheese, or yogurt. Fruits  Breastfeeding: 2 cups  Non-Breastfeeding: 2 cups  What Counts as a serving?  1 cup of fruit or juice.   cup dried fruit. Vegetables  Breastfeeding: 3 cups  Non-Breastfeeding: 2  cups  What Counts as a serving?  1 cup raw or cooked vegetables.  Juice or 2 cups raw leafy vegetables.  Grains  Breastfeeding: 8 oz  Non-Breastfeeding: 6 oz  What Counts as a serving?  1 slice bread.  1 oz ready-to-eat cereal.   cup cooked pasta, rice, or cereal. Meat and Beans  Breastfeeding: 6  oz  Non-Breastfeeding: 5  oz  What Counts as a serving?  1 oz lean meat, poultry, or fish   cup cooked dry beans   oz nuts or 1 egg  1 tbs peanut butter Milk  Breastfeeding: 3 cups  Non-Breastfeeding: 3 cups  What Counts as a serving?  1 cup milk.  8 oz yogurt.  1  oz  cheese.  2 oz processed cheese. TIPS FOR THE BREASTFEEDING MOM  Rapid weight loss is not suggested when you are breastfeeding. By simply breastfeeding, you will be able to lose the weight gained during your pregnancy. Your caregiver can keep track of your weight and tell you if your weight loss is appropriate.  Be sure to drink fluids. You may notice that you are thirstier than usual. A suggestion is to drink a glass of water or other beverage whenever you breastfeed.  Avoid alcohol as it can be passed into your breast milk.  Limit caffeine drinks to no more than 2 to 3 cups per day.  You may need to keep taking your prenatal vitamin while you are breastfeeding. Talk with your caregiver about taking a vitamin or supplement. RETURING TO A HEALTHY WEIGHT  The My Pyramid Plan for Moms will help you return to a healthy weight. It will also provide the nutrients you need.  You may need to limit "empty" calories. These include:  High fat foods like fried foods, fatty meats, fast food, butter, and mayonnaise.  High sugar foods like sodas, jelly, candy, and sweets.  Be physically active. Include 30 minutes of exercise or more each day. Choose an activity you like such as walking, swimming, biking, or aerobics. Check with your caregiver before you start to exercise. Document Released: 05/25/2007 Document Revised: 05/10/2011 Document Reviewed: 05/25/2007 [facility] Patient Information 2015 [place], [place]. This information is not intended to replace advice given to you by your health care provider. Make sure you discuss any questions you have with your health care provider. Postpartum Depression and Baby Blues The postpartum period begins right after the birth of a baby. During this time, there is often a great amount of joy and excitement. It is also a time of many changes in the life of the parents. Regardless of how many times a mother gives birth, each child brings new challenges and dynamics to  the family. It is not unusual to have feelings of excitement along with confusing shifts in moods, emotions, and thoughts. All mothers are at risk of developing postpartum depression or the "baby blues." These mood changes can occur right after giving birth, or they may occur many months after giving birth. The baby blues or postpartum depression can be mild or severe. Additionally, postpartum depression can go away rather quickly, or it can be a long-term condition.  CAUSES Raised hormone levels and the rapid drop in those levels are thought to be a main cause of postpartum depression and the baby blues. A number of hormones change during and after pregnancy. Estrogen and progesterone usually decrease right after the delivery of your baby. The levels of thyroid hormone and various cortisol steroids also rapidly drop. Other factors that play a role in these mood changes include major life events and genetics.  RISK FACTORS If you have any of  the following risks for the baby blues or postpartum depression, know what symptoms to watch out for during the postpartum period. Risk factors that may increase the likelihood of getting the baby blues or postpartum depression include:  Having a personal or family history of depression.   Having depression while being pregnant.   Having premenstrual mood issues or mood issues related to oral contraceptives.  Having a lot of life stress.   Having marital conflict.   Lacking a social support network.   Having a baby with special needs.   Having health problems, such as diabetes.  SIGNS AND SYMPTOMS Symptoms of baby blues include:  Brief changes in mood, such as going from extreme happiness to sadness.  Decreased concentration.   Difficulty sleeping.   Crying spells, tearfulness.   Irritability.   Anxiety.  Symptoms of postpartum depression typically begin within the first month after giving birth. These symptoms  include:  Difficulty sleeping or excessive sleepiness.   Marked weight loss.   Agitation.   Feelings of worthlessness.   Lack of interest in activity or food.  Postpartum psychosis is a very serious condition and can be dangerous. Fortunately, it is rare. Displaying any of the following symptoms is cause for immediate medical attention. Symptoms of postpartum psychosis include:   Hallucinations and delusions.   Bizarre or disorganized behavior.   Confusion or disorientation.  DIAGNOSIS  A diagnosis is made by an evaluation of your symptoms. There are no medical or lab tests that lead to a diagnosis, but there are various questionnaires that a health care provider may use to identify those with the baby blues, postpartum depression, or psychosis. Often, a screening tool called the [place] Postnatal Depression Scale is used to diagnose depression in the postpartum period.  TREATMENT The baby blues usually goes away on its own in 1-2 weeks. Social support is often all that is needed. You will be encouraged to get adequate sleep and rest. Occasionally, you may be given medicines to help you sleep.  Postpartum depression requires treatment because it can last several months or longer if it is not treated. Treatment may include individual or group therapy, medicine, or both to address any social, physiological, and psychological factors that may play a role in the depression. Regular exercise, a healthy diet, rest, and social support may also be strongly recommended.  Postpartum psychosis is more serious and needs treatment right away. Hospitalization is often needed. HOME CARE INSTRUCTIONS  Get as much rest as you can. Nap when the baby sleeps.   Exercise regularly. Some women find yoga and walking to be beneficial.   Eat a balanced and nourishing diet.   Do little things that you enjoy. Have a cup of tea, take a bubble bath, read your favorite magazine, or listen to your  favorite music.  Avoid alcohol.   Ask for help with household chores, cooking, grocery shopping, or running errands as needed. Do not try to do everything.   Talk to people close to you about how you are feeling. Get support from your partner, family members, friends, or other new moms.  Try to stay positive in how you think. Think about the things you are grateful for.   Do not spend a lot of time alone.   Only take over-the-counter or prescription medicine as directed by your health care provider.  Keep all your postpartum appointments.   Let your health care provider know if you have any concerns.  SEEK MEDICAL CARE IF: You  are having a reaction to or problems with your medicine. SEEK IMMEDIATE MEDICAL CARE IF:  You have suicidal feelings.   You think you may harm the baby or someone else. MAKE SURE YOU:  Understand these instructions.  Will watch your condition.  Will get help right away if you are not doing well or get worse. Document Released: 11/20/2003 Document Revised: 02/20/2013 Document Reviewed: 11/27/2012 [facility] Patient Information 2015 [place], [place]. This information is not intended to replace advice given to you by your health care provider. Make sure you discuss any questions you have with your health care provider. Breast Pumping Tips If you are breastfeeding, there may be times when you cannot feed your baby directly. Returning to work or going on a trip are common examples. Pumping allows you to store breast milk and feed it to your baby later.  You may not get much milk when you first start to pump. Your breasts should start to make more after a few days. If you pump at the times you usually feed your baby, you may be able to keep making enough milk to feed your baby without also using formula. The more often you pump, the more milk you will produce. WHEN SHOULD I PUMP?   You can begin to pump soon after delivery. However, some experts recommend  waiting about 4 weeks before giving your infant a bottle to make sure breastfeeding is going well.  If you plan to return to work, begin pumping a few weeks before. This will help you develop techniques that work best for you. It also lets you build up a supply of breast milk.   When you are with your infant, feed on demand and pump after each feeding.   When you are away from your infant for several hours, pump for about 15 minutes every 2-3 hours. Pump both breasts at the same time if you can.   If your infant has a formula feeding, make sure to pump around the same time.   If you drink any alcohol, wait 2 hours before pumping.  HOW DO I PREPARE TO PUMP? Your let-down reflexis the natural reaction to stimulation that makes your breast milk flow. It is easier to stimulate this reflex when you are relaxed. Find relaxation techniques that work for you. If you have difficulty with your let-down reflex, try these methods:   Smell one of your infant's blankets or an item of clothing.   Look at a picture or video of your infant.   Sit in a quiet, private space.   Massage the breast you plan to pump.   Place soothing warmth on the breast.   Play relaxing music.  WHAT ARE SOME GENERAL BREAST PUMPING TIPS?  Wash your hands before you pump. You do not need to wash your nipples or breasts.  There are three ways to pump.  You can use your hand to massage and compress your breast.  You can use a handheld manual pump.  You can use an electric pump.   Make sure the suction cup (flange) on the breast pump is the right size. Place the flange directly over the nipple. If it is the wrong size or placed the wrong way, it may be painful and cause nipple damage.   If pumping is uncomfortable, apply a small amount of purified or modified lanolin to your nipple and areola.  If you are using an electric pump, adjust the speed and suction power to be more comfortable.  If pumping  is painful or if you are not getting very much milk, you may need a different type of pump. A lactation consultant can help you determine what type of pump to use.   Keep a full water bottle near you at all times. Drinking lots of fluid helps you make more milk.  You can store your milk to use later. Pumped breast milk can be stored in a sealable, sterile container or plastic bag. Label all stored breast milk with the date you pumped it.  Milk can stay out at room temperature for up to 8 hours.  You can store your milk in the refrigerator for up to 8 days.  You can store your milk in the freezer for 3 months. Thaw frozen milk using warm water. Do not put it in the microwave.  Do not smoke. Smoking can lower your milk supply and harm your infant. If you need help quitting, ask your health care provider to recommend a program.  [place] A LACTATION CONSULTANT?  You are having trouble pumping.  You are concerned that you are not making enough milk.  You have nipple pain, soreness, or redness.  You want to use birth control. Birth control pills may lower your milk supply. Talk to your health care provider about your options. Document Released: 08/05/2009 Document Revised: 02/20/2013 Document Reviewed: 12/08/2012 [facility] Patient Information 2015 [place], [place]. This information is not intended to replace advice given to you by your health care provider. Make sure you discuss any questions you have with your health care provider. Postpartum Care After Vaginal Delivery After you deliver your newborn (postpartum period), the usual stay in the hospital is 24-72 hours. If there were problems with your labor or delivery, or if you have other medical problems, you might be in the hospital longer.  While you are in the hospital, you will receive help and instructions on how to care for yourself and your newborn during the postpartum period.  While you are in  the hospital:  Be sure to tell your nurses if you have pain or discomfort, as well as where you feel the pain and what makes the pain worse.  If you had an incision made near your vagina (episiotomy) or if you had some tearing during delivery, the nurses may put ice packs on your episiotomy or tear. The ice packs may help to reduce the pain and swelling.  If you are breastfeeding, you may feel uncomfortable contractions of your uterus for a couple of weeks. This is normal. The contractions help your uterus get back to normal size.  It is normal to have some bleeding after delivery.  For the first 1-3 days after delivery, the flow is red and the amount may be similar to a period.  It is common for the flow to start and stop.  In the first few days, you may pass some small clots. Let your nurses know if you begin to pass large clots or your flow increases.  Do not  flush blood clots down the toilet before having the nurse look at them.  During the next 3-10 days after delivery, your flow should become more watery and pink or brown-tinged in color.  Ten to fourteen days after delivery, your flow should be a small amount of yellowish-white discharge.  The amount of your flow will decrease over the first few weeks after delivery. Your flow may stop in 6-8 weeks. Most women  have had their flow stop by 12 weeks after delivery.  You should change your sanitary pads frequently.  Wash your hands thoroughly with soap and water for at least 20 seconds after changing pads, using the toilet, or before holding or feeding your newborn.  You should feel like you need to empty your bladder within the first 6-8 hours after delivery.  In case you become weak, lightheaded, or faint, call your nurse before you get out of bed for the first time and before you take a shower for the first time.  Within the first few days after delivery, your breasts may begin to feel tender and full. This is called  engorgement. Breast tenderness usually goes away within 48-72 hours after engorgement occurs. You may also notice milk leaking from your breasts. If you are not breastfeeding, do not stimulate your breasts. Breast stimulation can make your breasts produce more milk.  Spending as much time as possible with your newborn is very important. During this time, you and your newborn can feel close and get to know each other. Having your newborn stay in your room (rooming in) will help to strengthen the bond with your newborn. It will give you time to get to know your newborn and become comfortable caring for your newborn.  Your hormones change after delivery. Sometimes the hormone changes can temporarily cause you to feel sad or tearful. These feelings should not last more than a few days. If these feelings last longer than that, you should talk to your caregiver.  If desired, talk to your caregiver about methods of family planning or contraception.  Talk to your caregiver about immunizations. Your caregiver may want you to have the following immunizations before leaving the hospital:  Tetanus, diphtheria, and pertussis (Tdap) or tetanus and diphtheria (Td) immunization. It is very important that you and your family (including grandparents) or others caring for your newborn are up-to-date with the Tdap or Td immunizations. The Tdap or Td immunization can help protect your newborn from getting ill.  Rubella immunization.  Varicella (chickenpox) immunization.  Influenza immunization. You should receive this annual immunization if you did not receive the immunization during your pregnancy. Document Released: 12/13/2006 Document Revised: 11/10/2011 Document Reviewed: 10/13/2011 [facility] Patient Information 2015 [place], [place]. This information is not intended to replace advice given to you by your health care provider. Make sure you discuss any questions you have with your health care  provider. Breastfeeding and Mastitis Mastitis is inflammation of the breast tissue. It can occur in women who are breastfeeding. This can make breastfeeding painful. Mastitis will sometimes go away on its own. Your health care provider will help determine if treatment is needed. CAUSES Mastitis is often associated with a blocked milk (lactiferous) duct. This can happen when too much milk builds up in the breast. Causes of excess milk in the breast can include:  Poor latch-on. If your baby is not latched onto the breast properly, she or he may not empty your breast completely while breastfeeding.  Allowing too much time to pass between feedings.  Wearing a bra or other clothing that is too tight. This puts extra pressure on the lactiferous ducts so milk does not flow through them as it should. Mastitis can also be caused by a bacterial infection. Bacteria may enter the breast tissue through cuts or openings in the skin. In women who are breastfeeding, this may occur because of cracked or irritated skin. Cracks in the skin are often caused when your  baby does not latch on properly to the breast. SIGNS AND SYMPTOMS  Swelling, redness, tenderness, and pain in an area of the breast.  Swelling of the glands under the arm on the same side.  Fever may or may not accompany mastitis. If an infection is allowed to progress, a collection of pus (abscess) may develop. DIAGNOSIS  Your health care provider can usually diagnose mastitis based on your symptoms and a physical exam. Tests may be done to help confirm the diagnosis. These may include:  Removal of pus from the breast by applying pressure to the area. This pus can be examined in the lab to determine which bacteria are present. If an abscess has developed, the fluid in the abscess can be removed with a needle. This can also be used to confirm the diagnosis and determine the bacteria present. In most cases, pus will not be present.  Blood tests to  determine if your body is fighting a bacterial infection.  Mammogram or ultrasound tests to rule out other problems or diseases. TREATMENT  Mastitis that occurs with breastfeeding will sometimes go away on its own. Your health care provider may choose to wait 24 hours after first seeing you to decide whether a prescription medicine is needed. If your symptoms are worse after 24 hours, your health care provider will likely prescribe an antibiotic medicine to treat the mastitis. He or she will determine which bacteria are most likely causing the infection and will then select an appropriate antibiotic medicine. This is sometimes changed based on the results of tests performed to identify the bacteria, or if there is no response to the antibiotic medicine selected. Antibiotic medicines are usually given by mouth. You may also be given medicine for pain. HOME CARE INSTRUCTIONS  Only take over-the-counter or prescription medicines for pain, fever, or discomfort as directed by your health care provider.  If your health care provider prescribed an antibiotic medicine, take the medicine as directed. Make sure you finish it even if you start to feel better.  Do not wear a tight or underwire bra. Wear a soft, supportive bra.  Increase your fluid intake, especially if you have a fever.  Continue to empty the breast. Your health care provider can tell you whether this milk is safe for your infant or needs to be thrown out. You may be told to stop nursing until your health care provider thinks it is safe for your baby. Use a breast pump if you are advised to stop nursing.  Keep your nipples clean and dry.  Empty the first breast completely before going to the other breast. If your baby is not emptying your breasts completely for some reason, use a breast pump to empty your breasts.  If you go back to work, pump your breasts while at work to stay in time with your nursing schedule.  Avoid allowing your  breasts to become overly filled with milk (engorged). SEEK MEDICAL CARE IF:  You have pus-like discharge from the breast.  Your symptoms do not improve with the treatment prescribed by your health care provider within 2 days. SEEK IMMEDIATE MEDICAL CARE IF:  Your pain and swelling are getting worse.  You have pain that is not controlled with medicine.  You have a red line extending from the breast toward your armpit.  You have a fever or persistent symptoms for more than 2-3 days.  You have a fever and your symptoms suddenly get worse. MAKE SURE YOU:   Understand these  instructions.  Will watch your condition.  Will get help right away if you are not doing well or get worse. Document Released: 06/12/2004 Document Revised: 02/20/2013 Document Reviewed: 09/21/2012 [facility] Patient Information 2015 [place], [place]. This information is not intended to replace advice given to you by your health care provider. Make sure you discuss any questions you have with your health care provider. Breastfeeding Deciding to breastfeed is one of the best choices you can make for you and your baby. A change in hormones during pregnancy causes your breast tissue to grow and increases the number and size of your milk ducts. These hormones also allow proteins, sugars, and fats from your blood supply to make breast milk in your milk-producing glands. Hormones prevent breast milk from being released before your baby is born as well as prompt milk flow after birth. Once breastfeeding has begun, thoughts of your baby, as well as his or her sucking or crying, can stimulate the release of milk from your milk-producing glands.  BENEFITS OF BREASTFEEDING For Your Baby  Your first milk (colostrum) helps your baby's digestive system function better.   There are antibodies in your milk that help your baby fight off infections.   Your baby has a lower incidence of asthma, allergies, and sudden infant death  syndrome.   The nutrients in breast milk are better for your baby than infant formulas and are designed uniquely for your baby's needs.   Breast milk improves your baby's brain development.   Your baby is less likely to develop other conditions, such as childhood obesity, asthma, or type 2 diabetes mellitus.  For You   Breastfeeding helps to create a very special bond between you and your baby.   Breastfeeding is convenient. Breast milk is always available at the correct temperature and costs nothing.   Breastfeeding helps to burn calories and helps you lose the weight gained during pregnancy.   Breastfeeding makes your uterus contract to its prepregnancy size faster and slows bleeding (lochia) after you give birth.   Breastfeeding helps to lower your risk of developing type 2 diabetes mellitus, osteoporosis, and breast or ovarian cancer later in life. SIGNS THAT YOUR BABY IS HUNGRY Early Signs of Hunger  Increased alertness or activity.  Stretching.  Movement of the head from side to side.  Movement of the head and opening of the mouth when the corner of the mouth or cheek is stroked (rooting).  Increased sucking sounds, smacking lips, cooing, sighing, or squeaking.  Hand-to-mouth movements.  Increased sucking of fingers or hands. Late Signs of Hunger  Fussing.  Intermittent crying. Extreme Signs of Hunger Signs of extreme hunger will require calming and consoling before your baby will be able to breastfeed successfully. Do not wait for the following signs of extreme hunger to occur before you initiate breastfeeding:   Restlessness.  A loud, strong cry.   Screaming. BREASTFEEDING BASICS Breastfeeding Initiation  Find a comfortable place to sit or lie down, with your neck and back well supported.  Place a pillow or rolled up blanket under your baby to bring him or her to the level of your breast (if you are seated). Nursing pillows are specially designed  to help support your arms and your baby while you breastfeed.  Make sure that your baby's abdomen is facing your abdomen.   Gently massage your breast. With your fingertips, massage from your chest wall toward your nipple in a circular motion. This encourages milk flow. You may need to continue  this action during the feeding if your milk flows slowly.  Support your breast with 4 fingers underneath and your thumb above your nipple. Make sure your fingers are well away from your nipple and your baby's mouth.   Stroke your baby's lips gently with your finger or nipple.   When your baby's mouth is open wide enough, quickly bring your baby to your breast, placing your entire nipple and as much of the colored area around your nipple (areola) as possible into your baby's mouth.   More areola should be visible above your baby's upper lip than below the lower lip.   Your baby's tongue should be between his or her lower gum and your breast.   Ensure that your baby's mouth is correctly positioned around your nipple (latched). Your baby's lips should create a seal on your breast and be turned out (everted).  It is common for your baby to suck about 2-3 minutes in order to start the flow of breast milk. Latching Teaching your baby how to latch on to your breast properly is very important. An improper latch can cause nipple pain and decreased milk supply for you and poor weight gain in your baby. Also, if your baby is not latched onto your nipple properly, he or she may swallow some air during feeding. This can make your baby fussy. Burping your baby when you switch breasts during the feeding can help to get rid of the air. However, teaching your baby to latch on properly is still the best way to prevent fussiness from swallowing air while breastfeeding. Signs that your baby has successfully latched on to your nipple:    Silent tugging or silent sucking, without causing you pain.   Swallowing  heard between every 3-4 sucks.    Muscle movement above and in front of his or her ears while sucking.  Signs that your baby has not successfully latched on to nipple:   Sucking sounds or smacking sounds from your baby while breastfeeding.  Nipple pain. If you think your baby has not latched on correctly, slip your finger into the corner of your baby's mouth to break the suction and place it between your baby's gums. Attempt breastfeeding initiation again. Signs of Successful Breastfeeding Signs from your baby:   A gradual decrease in the number of sucks or complete cessation of sucking.   Falling asleep.   Relaxation of his or her body.   Retention of a small amount of milk in his or her mouth.   Letting go of your breast by himself or herself. Signs from you:  Breasts that have increased in firmness, weight, and size 1-3 hours after feeding.   Breasts that are softer immediately after breastfeeding.  Increased milk volume, as well as a change in milk consistency and color by the fifth day of breastfeeding.   Nipples that are not sore, cracked, or bleeding. Signs That Your [employer] is Getting Enough Milk  Wetting at least 3 diapers in a 24-hour period. The urine should be clear and pale yellow by age 451 days.  At least 3 stools in a 24-hour period by age 451 days. The stool should be soft and yellow.  At least 3 stools in a 24-hour period by age 56 days. The stool should be seedy and yellow.  No loss of weight greater than 10% of birth weight during the first 58 days of age.  Average weight gain of 4-7 ounces (113-198 g) per week after age 45 days.  Consistent daily weight gain by age 42 days, without weight loss after the age of 2 weeks. After a feeding, your baby may spit up a small amount. This is common. BREASTFEEDING FREQUENCY AND DURATION Frequent feeding will help you make more milk and can prevent sore nipples and breast engorgement. Breastfeed when you feel the  need to reduce the fullness of your breasts or when your baby shows signs of hunger. This is called "breastfeeding on demand." Avoid introducing a pacifier to your baby while you are working to establish breastfeeding (the first 4-6 weeks after your baby is born). After this time you may choose to use a pacifier. Research has shown that pacifier use during the first year of a baby's life decreases the risk of sudden infant death syndrome (SIDS). Allow your baby to feed on each breast as long as he or she wants. Breastfeed until your baby is finished feeding. When your baby unlatches or falls asleep while feeding from the first breast, offer the second breast. Because newborns are often sleepy in the first few weeks of life, you may need to awaken your baby to get him or her to feed. Breastfeeding times will vary from baby to baby. However, the following rules can serve as a guide to help you ensure that your baby is properly fed:  Newborns (babies 108 weeks of age or younger) may breastfeed every 1-3 hours.  Newborns should not go longer than 3 hours during the day or 5 hours during the night without breastfeeding.  You should breastfeed your baby a minimum of 8 times in a 24-hour period until you begin to introduce solid foods to your baby at around 6 months of age. BREAST MILK PUMPING Pumping and storing breast milk allows you to ensure that your baby is exclusively fed your breast milk, even at times when you are unable to breastfeed. This is especially important if you are going back to work while you are still breastfeeding or when you are not able to be present during feedings. Your lactation consultant can give you guidelines on how long it is safe to store breast milk.  A breast pump is a machine that allows you to pump milk from your breast into a sterile bottle. The pumped breast milk can then be stored in a refrigerator or freezer. Some breast pumps are operated by hand, while others use  electricity. Ask your lactation consultant which type will work best for you. Breast pumps can be purchased, but some hospitals and breastfeeding support groups lease breast pumps on a monthly basis. A lactation consultant can teach you how to hand express breast milk, if you prefer not to use a pump.  CARING FOR YOUR BREASTS WHILE YOU BREASTFEED Nipples can become dry, cracked, and sore while breastfeeding. The following recommendations can help keep your breasts moisturized and healthy:  Avoid using soap on your nipples.   Wear a supportive bra. Although not required, special nursing bras and tank tops are designed to allow access to your breasts for breastfeeding without taking off your entire bra or top. Avoid wearing underwire-style bras or extremely tight bras.  Air dry your nipples for 3-85minutes after each feeding.   Use only cotton bra pads to absorb leaked breast milk. Leaking of breast milk between feedings is normal.   Use lanolin on your nipples after breastfeeding. Lanolin helps to maintain your skin's normal moisture barrier. If you use pure lanolin, you do not need to wash it off before feeding  your baby again. Pure lanolin is not toxic to your baby. You may also hand express a few drops of breast milk and gently massage that milk into your nipples and allow the milk to air dry. In the first few weeks after giving birth, some women experience extremely full breasts (engorgement). Engorgement can make your breasts feel heavy, warm, and tender to the touch. Engorgement peaks within 3-5 days after you give birth. The following recommendations can help ease engorgement:  Completely empty your breasts while breastfeeding or pumping. You may want to start by applying warm, moist heat (in the shower or with warm water-soaked hand towels) just before feeding or pumping. This increases circulation and helps the milk flow. If your baby does not completely empty your breasts while  breastfeeding, pump any extra milk after he or she is finished.  Wear a snug bra (nursing or regular) or tank top for 1-2 days to signal your body to slightly decrease milk production.  Apply ice packs to your breasts, unless this is too uncomfortable for you.  Make sure that your baby is latched on and positioned properly while breastfeeding. If engorgement persists after 48 hours of following these recommendations, contact your health care provider or a Science writer. OVERALL HEALTH CARE RECOMMENDATIONS WHILE BREASTFEEDING  Eat healthy foods. Alternate between meals and snacks, eating 3 of each per day. Because what you eat affects your breast milk, some of the foods may make your baby more irritable than usual. Avoid eating these foods if you are sure that they are negatively affecting your baby.  Drink milk, fruit juice, and water to satisfy your thirst (about 10 glasses a day).   Rest often, relax, and continue to take your prenatal vitamins to prevent fatigue, stress, and anemia.  Continue breast self-awareness checks.  Avoid chewing and smoking tobacco.  Avoid alcohol and drug use. Some medicines that may be harmful to your baby can pass through breast milk. It is important to ask your health care provider before taking any medicine, including all over-the-counter and prescription medicine as well as vitamin and herbal supplements. It is possible to become pregnant while breastfeeding. If birth control is desired, ask your health care provider about options that will be safe for your baby. SEEK MEDICAL CARE IF:   You feel like you want to stop breastfeeding or have become frustrated with breastfeeding.  You have painful breasts or nipples.  Your nipples are cracked or bleeding.  Your breasts are red, tender, or warm.  You have a swollen area on either breast.  You have a fever or chills.  You have nausea or vomiting.  You have drainage other than breast milk from  your nipples.  Your breasts do not become full before feedings by the fifth day after you give birth.  You feel sad and depressed.  Your baby is too sleepy to eat well.  Your baby is having trouble sleeping.   Your baby is wetting less than 3 diapers in a 24-hour period.  Your baby has less than 3 stools in a 24-hour period.  Your baby's skin or the white part of his or her eyes becomes yellow.   Your baby is not gaining weight by 68 days of age. SEEK IMMEDIATE MEDICAL CARE IF:   Your baby is overly tired (lethargic) and does not want to wake up and feed.  Your baby develops an unexplained fever. Document Released: 02/15/2005 Document Revised: 02/20/2013 Document Reviewed: 08/09/2012 Exit[employer] Patient Information 2015 [employer]. This information is not intended to replace advice given to you by your health care provider. Make sure you discuss any questions you have with your health care provider.

## 2015-05-17 ENCOUNTER — Encounter (HOSPITAL_COMMUNITY): Payer: Self-pay | Admitting: *Deleted

## 2015-05-17 ENCOUNTER — Emergency Department (HOSPITAL_COMMUNITY): Payer: 59

## 2015-05-17 ENCOUNTER — Emergency Department (HOSPITAL_COMMUNITY)
Admission: EM | Admit: 2015-05-17 | Discharge: 2015-05-18 | Disposition: A | Discharge status: Home / Self Care | Payer: 59 | Attending: Emergency Medicine | Admitting: Emergency Medicine

## 2015-05-17 DIAGNOSIS — R061 Stridor: Secondary | ICD-10-CM

## 2015-05-17 DIAGNOSIS — R0600 Dyspnea, unspecified: Secondary | ICD-10-CM | POA: Diagnosis not present

## 2015-05-17 DIAGNOSIS — R0602 Shortness of breath: Secondary | ICD-10-CM | POA: Diagnosis present

## 2015-05-17 DIAGNOSIS — Z8679 Personal history of other diseases of the circulatory system: Secondary | ICD-10-CM | POA: Diagnosis not present

## 2015-05-17 DIAGNOSIS — Z8719 Personal history of other diseases of the digestive system: Secondary | ICD-10-CM | POA: Insufficient documentation

## 2015-05-17 DIAGNOSIS — J383 Other diseases of vocal cords: Secondary | ICD-10-CM | POA: Insufficient documentation

## 2015-05-17 DIAGNOSIS — Z87440 Personal history of urinary (tract) infections: Secondary | ICD-10-CM | POA: Diagnosis not present

## 2015-05-17 DIAGNOSIS — J069 Acute upper respiratory infection, unspecified: Secondary | ICD-10-CM | POA: Insufficient documentation

## 2015-05-17 LAB — BASIC METABOLIC PANEL
ANION GAP: 13 (ref 5–15)
BUN: 15 mg/dL (ref 6–20)
CALCIUM: 9.4 mg/dL (ref 8.9–10.3)
CO2: 26 mmol/L (ref 22–32)
CREATININE: 0.43 mg/dL — AB (ref 0.44–1.00)
Chloride: 104 mmol/L (ref 101–111)
GFR calc Af Amer: >60 mL/min (ref >60)
GFR calc non Af Amer: >60 mL/min (ref >60)
GLUCOSE: 133 mg/dL — AB (ref 65–99)
Potassium: 3.5 mmol/L (ref 3.5–5.1)
Sodium: 143 mmol/L (ref 135–145)

## 2015-05-17 MED ORDER — ALBUTEROL SULFATE (2.5 MG/3ML) 0.083% IN NEBU
INHALATION_SOLUTION | RESPIRATORY_TRACT | Status: AC
  Filled 2015-05-17: qty 6

## 2015-05-17 MED ORDER — METHYLPREDNISOLONE SODIUM SUCC 125 MG IJ SOLR
125.0000 mg | Freq: Once | INTRAMUSCULAR | Status: AC
  Administered 2015-05-17: 125 mg via INTRAVENOUS
  Filled 2015-05-17: qty 2

## 2015-05-17 MED ORDER — RACEPINEPHRINE HCL 2.25 % IN NEBU
0.5000 mL | INHALATION_SOLUTION | Freq: Once | RESPIRATORY_TRACT | Status: AC
  Administered 2015-05-17: 0.5 mL via RESPIRATORY_TRACT
  Filled 2015-05-17: qty 0.5

## 2015-05-17 MED ORDER — ALBUTEROL (5 MG/ML) CONTINUOUS INHALATION SOLN
INHALATION_SOLUTION | RESPIRATORY_TRACT | Status: AC
  Filled 2015-05-17: qty 20

## 2015-05-17 MED ORDER — SODIUM CHLORIDE 0.9 % IV BOLUS (SEPSIS)
1000.0000 mL | Freq: Once | INTRAVENOUS | Status: AC
  Administered 2015-05-17: 1000 mL via INTRAVENOUS

## 2015-05-17 NOTE — ED Notes (Signed)
Delo MD at bedside.

## 2015-05-17 NOTE — ED Notes (Signed)
MD at bedside. 

## 2015-05-17 NOTE — ED Provider Notes (Signed)
MSE was initiated and I personally evaluated the patient and placed orders (if any) at  10:47 PM on May 17, 2015.  Pt presents to the ED with difficulty breathing.  She has had flu symptoms this week but this evening started having stridor.  She has a history of paroxysmal vocal cord disroder.  She never has been intubated.  In the past racemic epinephrine and steroids have helped.  On exam HEENT  Mild erythema post pharnyx, no uvula edema Lungs Stridor, no wheezes Abd soft non tender Extrem no edema Skin no rash  Will start IV steroids, racemic epi.  Dorie Rank, MD 05/17/15 2253

## 2015-05-17 NOTE — ED Notes (Addendum)
Pt with onset of shortness of breath/flu like symptoms  for about 3 days, worse over 30 minutes. 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

## 2015-05-17 NOTE — ED Provider Notes (Signed)
CSN: [id number]     Arrival date & time 05/17/15  2233 History  By signing my name below, I, Altamease Oiler, attest that this documentation has been prepared under the direction and in the presence of Veryl Speak, MD. Electronically Signed: Altamease Oiler, ED Scribe. 05/17/2015. 11:52 PM   Chief Complaint  Patient presents with  . Shortness of Breath    The history is provided by the patient. No language interpreter was used.   Amanda Barker is a 25 y.o. female with PMHx of SVT who presents to the Emergency Department complaining of constant SOB with sudden onset 30 minutes PTA. Pt states that over the last 3 days she has had body aches, sore throat, and cough productive of green sputum but tonight started to have SOB and stridor. She had similar symptoms 5 years ago with paroxymal vocal cord disorder that was treated with steroids at [facility]. Pt denies known sick contact.    Past Medical History  Diagnosis Date  . SVT (supraventricular tachycardia) ([place]) 11/2009  . SVT (supraventricular tachycardia) ([place]) 11/2009  . GERD (gastroesophageal reflux disease)   . Urinary tract infection, site not specified   . Elevated blood pressure affecting pregnancy in third trimester, antepartum 10/05/2014  . Postpartum care following vaginal delivery (8/24) 10/23/2014   Past Surgical History  Procedure Laterality Date  . Shoulder arthroscopy  12/2005  . Adenoidectomy     Family History  Problem Relation Age of Onset  . Hypertension Paternal Grandmother    Social History  Substance Use Topics  . Smoking status: Never Smoker   . Smokeless tobacco: None  . Alcohol Use: No   OB History    Gravida Para Term Preterm AB TAB SAB Ectopic Multiple Living   2 2 2  0 0 0 0 0 0 2     Review of Systems  10 Systems reviewed and all are negative for acute change except as noted in the HPI.  Allergies  Sulfa antibiotics  Home Medications   Prior to Admission medications    Medication Sig Start Date End Date Taking? Authorizing Provider  ibuprofen (ADVIL,MOTRIN) 200 MG tablet Take 600 mg by mouth every 6 (six) hours as needed (for pain.).   Yes Historical Provider, MD  naproxen sodium (ANAPROX) 220 MG tablet Take 440 mg by mouth 2 (two) times daily as needed (for pain.).   Yes Historical Provider, MD   Pulse 145  Resp 26  SpO2 100%  Breastfeeding? Yes Physical Exam  Constitutional: She is oriented to person, place, and time. She appears well-developed and well-nourished. No distress.  HENT:  Head: Normocephalic and atraumatic.  Mouth/Throat: Oropharynx is clear and moist.  Eyes: EOM are normal.  Neck: Normal range of motion.  Cardiovascular: Normal rate, regular rhythm and normal heart sounds.   Pulmonary/Chest: Breath sounds normal. She is in respiratory distress. She has no wheezes. She has no rales.  There is expiratory stridor noted with mild respiratory distress.   Abdominal: Soft. She exhibits no distension. There is no tenderness.  Musculoskeletal: Normal range of motion.  Lymphadenopathy:    She has no cervical adenopathy.  Neurological: She is alert and oriented to person, place, and time.  Skin: Skin is warm and dry.  Psychiatric: She has a normal mood and affect. Judgment normal.  Nursing note and vitals reviewed.   ED Course  Procedures (including critical care time) DIAGNOSTIC STUDIES: Oxygen Saturation is 100% on RA,  normal by my interpretation.    COORDINATION  OF CARE: 11:16 PM Discussed treatment plan which includes Racepinephrine, Solumedrol,  IVF, CXR, and lab work with pt at bedside and pt agreed to plan.  Labs Review Labs Reviewed  BASIC METABOLIC PANEL - Abnormal; Notable for the following:    Glucose, Bld 133 (*)    Creatinine, Ser 0.43 (*)    All other components within normal limits    Imaging Review Dg Chest Portable 1 View  05/17/2015  CLINICAL DATA:  Dyspnea and flu-like symptoms for 3 days, worsened over the  past 30 minutes. EXAM: PORTABLE CHEST 1 VIEW COMPARISON:  12/11/2009 FINDINGS: A single AP portable view of the chest demonstrates no focal airspace consolidation or alveolar edema. The lungs are grossly clear. There is no large effusion or pneumothorax. Cardiac and mediastinal contours appear unremarkable. IMPRESSION: No active disease. Electronically Signed   By: Andreas Newport M.D.   On: 05/17/2015 23:07   I have personally reviewed and evaluated these images and lab results as part of my medical decision-making.    MDM   Final diagnoses:  None    Patient presents with complaints of difficulty breathing. She reports URI-like symptoms that have been going on for several days. On exam, she does have some stridor which responded well to IV steroids and racemic epinephrine. She reports this happening in the past several years ago and was told she had some sort of vocal cord dysfunction. She denies any fevers. She will be discharged with prednisone for the next several days, and when necessary return if she worsens. We had discussed the possibility of performing a CT scan, however she is declining this.  I personally performed the services described in this documentation, which was scribed in my presence. The recorded information has been reviewed and is accurate.       Veryl Speak, MD 05/18/15 [phone]

## 2015-05-18 DIAGNOSIS — J383 Other diseases of vocal cords: Secondary | ICD-10-CM | POA: Diagnosis not present

## 2015-05-18 DIAGNOSIS — Z8679 Personal history of other diseases of the circulatory system: Secondary | ICD-10-CM | POA: Diagnosis not present

## 2015-05-18 DIAGNOSIS — J069 Acute upper respiratory infection, unspecified: Secondary | ICD-10-CM | POA: Diagnosis not present

## 2015-05-18 DIAGNOSIS — Z87440 Personal history of urinary (tract) infections: Secondary | ICD-10-CM | POA: Diagnosis not present

## 2015-05-18 DIAGNOSIS — Z8719 Personal history of other diseases of the digestive system: Secondary | ICD-10-CM | POA: Diagnosis not present

## 2015-05-18 MED ORDER — PREDNISONE 10 MG PO TABS: 20.0000 mg | ORAL_TABLET | Freq: Two times a day (BID) | ORAL | Status: DC

## 2015-05-18 NOTE — Discharge Instructions (Signed)
Prednisone as prescribed.  Return to the emergency department if you develop worsening breathing, severe throat pain, or other new and concerning symptoms.   Laryngitis Laryngitis is inflammation of your vocal cords. This causes hoarseness, coughing, loss of voice, sore throat, or a dry throat. Your vocal cords are two bands of muscles that are found in your throat. When you speak, these cords come together and vibrate. These vibrations come out through your mouth as sound. When your vocal cords are inflamed, your voice sounds different. Laryngitis can be temporary (acute) or long-term (chronic). Most cases of acute laryngitis improve with time. Chronic laryngitis is laryngitis that lasts for more than three weeks. CAUSES Acute laryngitis may be caused by:  A viral infection.  Lots of talking, yelling, or singing. This is also called vocal strain.  Bacterial infections. Chronic laryngitis may be caused by:  Vocal strain.  Injury to your vocal cords.  Acid reflux (gastroesophageal reflux disease or GERD).  Allergies.  Sinus infection.  Smoking.  Alcohol abuse.  Breathing in chemicals or dust.  Growths on the vocal cords. RISK FACTORS Risk factors for laryngitis include:  Smoking.  Alcohol abuse.  Having allergies. SIGNS AND SYMPTOMS Symptoms of laryngitis may include:  Low, hoarse voice.  Loss of voice.  Dry cough.  Sore throat.  Stuffy nose. DIAGNOSIS Laryngitis may be diagnosed by:  Physical exam.  Throat culture.  Blood test.  Laryngoscopy. This procedure allows your health care provider to look at your vocal cords with a mirror or viewing tube. TREATMENT Treatment for laryngitis depends on what is causing it. Usually, treatment involves resting your voice and using medicines to soothe your throat. However, if your laryngitis is caused by a bacterial infection, you may need to take antibiotic medicine. If your laryngitis is caused by a growth, you  may need to have a procedure to remove it. HOME CARE INSTRUCTIONS  Drink enough fluid to keep your urine clear or pale yellow.  Breathe in moist air. Use a humidifier if you live in a dry climate.  Take medicines only as directed by your health care provider.  If you were prescribed an antibiotic medicine, finish it all even if you start to feel better.  Do not smoke cigarettes or electronic cigarettes. If you need help quitting, ask your health care provider.  Talk as little as possible. Also avoid whispering, which can cause vocal strain.  Write instead of talking. Do this until your voice is back to normal. SEEK MEDICAL CARE IF:  You have a fever.  You have increasing pain.  You have difficulty swallowing. SEEK IMMEDIATE MEDICAL CARE IF:  You cough up blood.  You have trouble breathing.   This information is not intended to replace advice given to you by your health care provider. Make sure you discuss any questions you have with your health care provider.   Document Released: 02/15/2005 Document Revised: 03/08/2014 Document Reviewed: 07/31/2013 Elsevier Interactive Patient Education 2016 [employer]  Upper Respiratory Infection, Adult Most upper respiratory infections (URIs) are a viral infection of the air passages leading to the lungs. A URI affects the nose, throat, and upper air passages. The most common type of URI is nasopharyngitis and is typically referred to as "the common cold." URIs run their course and usually go away on their own. Most of the time, a URI does not require medical attention, but sometimes a bacterial infection in the upper airways can follow a viral infection. This is called a secondary  infection. Sinus and middle ear infections are common types of secondary upper respiratory infections. Bacterial pneumonia can also complicate a URI. A URI can worsen asthma and chronic obstructive pulmonary disease (COPD). Sometimes, these complications can  require emergency medical care and may be life threatening.  CAUSES Almost all URIs are caused by viruses. A virus is a type of germ and can spread from one person to another.  RISKS FACTORS You may be at risk for a URI if:   You smoke.   You have chronic heart or lung disease.  You have a weakened defense (immune) system.   You are very young or very old.   You have nasal allergies or asthma.  You work in crowded or poorly ventilated areas.  You work in health care facilities or schools. SIGNS AND SYMPTOMS  Symptoms typically develop 2-3 days after you come in contact with a cold virus. Most viral URIs last 7-10 days. However, viral URIs from the influenza virus (flu virus) can last 14-18 days and are typically more severe. Symptoms may include:   Runny or stuffy (congested) nose.   Sneezing.   Cough.   Sore throat.   Headache.   Fatigue.   Fever.   Loss of appetite.   Pain in your forehead, behind your eyes, and over your cheekbones (sinus pain).  Muscle aches.  DIAGNOSIS  Your health care provider may diagnose a URI by:  Physical exam.  Tests to check that your symptoms are not due to another condition such as:  Strep throat.  Sinusitis.  Pneumonia.  Asthma. TREATMENT  A URI goes away on its own with time. It cannot be cured with medicines, but medicines may be prescribed or recommended to relieve symptoms. Medicines may help:  Reduce your fever.  Reduce your cough.  Relieve nasal congestion. HOME CARE INSTRUCTIONS   Take medicines only as directed by your health care provider.   Gargle warm saltwater or take cough drops to comfort your throat as directed by your health care provider.  Use a warm mist humidifier or inhale steam from a shower to increase air moisture. This may make it easier to breathe.  Drink enough fluid to keep your urine clear or pale yellow.   Eat soups and other clear broths and maintain good nutrition.    Rest as needed.   Return to work when your temperature has returned to normal or as your health care provider advises. You may need to stay home longer to avoid infecting others. You can also use a face mask and careful hand washing to prevent spread of the virus.  Increase the usage of your inhaler if you have asthma.   Do not use any tobacco products, including cigarettes, chewing tobacco, or electronic cigarettes. If you need help quitting, ask your health care provider. PREVENTION  The best way to protect yourself from getting a cold is to practice good hygiene.   Avoid oral or hand contact with people with cold symptoms.   Wash your hands often if contact occurs.  There is no clear evidence that vitamin C, vitamin E, echinacea, or exercise reduces the chance of developing a cold. However, it is always recommended to get plenty of rest, exercise, and practice good nutrition.  SEEK MEDICAL CARE IF:   You are getting worse rather than better.   Your symptoms are not controlled by medicine.   You have chills.  You have worsening shortness of breath.  You have brown or red mucus.  You have yellow or brown nasal discharge.  You have pain in your face, especially when you bend forward.  You have a fever.  You have swollen neck glands.  You have pain while swallowing.  You have white areas in the back of your throat. SEEK IMMEDIATE MEDICAL CARE IF:   You have severe or persistent:  Headache.  Ear pain.  Sinus pain.  Chest pain.  You have chronic lung disease and any of the following:  Wheezing.  Prolonged cough.  Coughing up blood.  A change in your usual mucus.  You have a stiff neck.  You have changes in your:  Vision.  Hearing.  Thinking.  Mood. MAKE SURE YOU:   Understand these instructions.  Will watch your condition.  Will get help right away if you are not doing well or get worse.   This information is not intended to  replace advice given to you by your health care provider. Make sure you discuss any questions you have with your health care provider.   Document Released: 08/11/2000 Document Revised: 07/02/2014 Document Reviewed: 05/23/2013 Elsevier Interactive Patient Education [employer].

## 2016-03-31 DIAGNOSIS — D2239 Melanocytic nevi of other parts of face: Secondary | ICD-10-CM | POA: Diagnosis not present

## 2016-07-19 DIAGNOSIS — A084 Viral intestinal infection, unspecified: Secondary | ICD-10-CM | POA: Diagnosis not present

## 2016-07-19 DIAGNOSIS — R300 Dysuria: Secondary | ICD-10-CM | POA: Diagnosis not present

## 2016-07-19 DIAGNOSIS — Z6826 Body mass index (BMI) 26.0-26.9, adult: Secondary | ICD-10-CM | POA: Diagnosis not present

## 2016-08-18 DIAGNOSIS — L700 Acne vulgaris: Secondary | ICD-10-CM | POA: Diagnosis not present

## 2016-08-19 DIAGNOSIS — Z6827 Body mass index (BMI) 27.0-27.9, adult: Secondary | ICD-10-CM | POA: Diagnosis not present

## 2016-08-19 DIAGNOSIS — Z0000 Encounter for general adult medical examination without abnormal findings: Secondary | ICD-10-CM | POA: Diagnosis not present

## 2016-08-25 DIAGNOSIS — Z0000 Encounter for general adult medical examination without abnormal findings: Secondary | ICD-10-CM | POA: Diagnosis not present

## 2017-11-08 MED FILL — CITALOPRAM HBR 10 MG TABLET: 10 | 30 days supply | Qty: 30 | Fill #0

## 2017-12-12 MED FILL — CITALOPRAM HBR 10 MG TABLET: 10 | 30 days supply | Qty: 30 | Fill #1

## 2018-01-10 MED FILL — CITALOPRAM HBR 10 MG TABLET: 10 | 30 days supply | Qty: 30 | Fill #2

## 2018-02-07 MED FILL — CITALOPRAM HBR 10 MG TABLET: 10 | 30 days supply | Qty: 30 | Fill #3

## 2018-02-07 MED FILL — SHIPPING COST: 1 days supply | Qty: 1 | Fill #0

## 2018-03-09 MED FILL — CITALOPRAM HBR 10 MG TABLET: 10 | 30 days supply | Qty: 30 | Fill #4

## 2018-03-09 MED FILL — SHIPPING COST: 1 days supply | Qty: 1 | Fill #1

## 2018-04-11 MED FILL — SHIPPING COST: 1 days supply | Qty: 1 | Fill #2

## 2018-04-11 MED FILL — CITALOPRAM HBR 10 MG TABLET: 10 | 30 days supply | Qty: 30 | Fill #5

## 2018-05-23 MED FILL — CITALOPRAM HBR 10 MG TABLET: 10 | 30 days supply | Qty: 30 | Fill #6

## 2018-06-19 MED FILL — CITALOPRAM HBR 10 MG TABLET: 10 | 30 days supply | Qty: 30 | Fill #7

## 2018-07-25 MED FILL — CITALOPRAM HBR 10 MG TABLET: 10 | 30 days supply | Qty: 30 | Fill #8

## 2018-08-28 MED FILL — CITALOPRAM HBR 10 MG TABLET: 10 | 30 days supply | Qty: 30 | Fill #0

## 2018-09-26 MED FILL — CITALOPRAM HBR 10 MG TABLET: 10 | 30 days supply | Qty: 30 | Fill #0

## 2018-11-01 MED FILL — CITALOPRAM HBR 10 MG TABLET: 10 | 30 days supply | Qty: 30 | Fill #1

## 2018-12-06 MED FILL — CITALOPRAM HBR 10 MG TABLET: 10 | 30 days supply | Qty: 30 | Fill #0

## 2018-12-19 ENCOUNTER — Other Ambulatory Visit: Payer: Self-pay | Admitting: Nephrology

## 2018-12-19 DIAGNOSIS — S139XXA Sprain of joints and ligaments of unspecified parts of neck, initial encounter: Secondary | ICD-10-CM

## 2018-12-20 ENCOUNTER — Emergency Department (HOSPITAL_COMMUNITY): Payer: No Typology Code available for payment source

## 2018-12-20 ENCOUNTER — Other Ambulatory Visit: Payer: Self-pay

## 2018-12-20 ENCOUNTER — Encounter (HOSPITAL_COMMUNITY): Payer: Self-pay | Admitting: *Deleted

## 2018-12-20 ENCOUNTER — Emergency Department (HOSPITAL_COMMUNITY)
Admission: EM | Admit: 2018-12-20 | Discharge: 2018-12-20 | Disposition: A | Discharge status: Home / Self Care | Payer: No Typology Code available for payment source | Attending: Emergency Medicine | Admitting: Emergency Medicine

## 2018-12-20 DIAGNOSIS — X501XXA Overexertion from prolonged static or awkward postures, initial encounter: Secondary | ICD-10-CM | POA: Diagnosis not present

## 2018-12-20 DIAGNOSIS — Y999 Unspecified external cause status: Secondary | ICD-10-CM | POA: Insufficient documentation

## 2018-12-20 DIAGNOSIS — M436 Torticollis: Secondary | ICD-10-CM | POA: Diagnosis not present

## 2018-12-20 DIAGNOSIS — S161XXA Strain of muscle, fascia and tendon at neck level, initial encounter: Secondary | ICD-10-CM | POA: Diagnosis not present

## 2018-12-20 DIAGNOSIS — S199XXA Unspecified injury of neck, initial encounter: Secondary | ICD-10-CM | POA: Diagnosis present

## 2018-12-20 DIAGNOSIS — Z79899 Other long term (current) drug therapy: Secondary | ICD-10-CM | POA: Insufficient documentation

## 2018-12-20 DIAGNOSIS — Y9343 Activity, gymnastics: Secondary | ICD-10-CM | POA: Insufficient documentation

## 2018-12-20 DIAGNOSIS — Y929 Unspecified place or not applicable: Secondary | ICD-10-CM | POA: Insufficient documentation

## 2018-12-20 MED ORDER — METHOCARBAMOL 500 MG PO TABS: 500.0000 mg | ORAL_TABLET | Freq: Three times a day (TID) | ORAL | 0 refills | Status: DC | PRN

## 2018-12-20 MED ORDER — MELOXICAM 15 MG PO TABS: 15.0000 mg | ORAL_TABLET | Freq: Every day | ORAL | 0 refills | Status: DC

## 2018-12-20 NOTE — ED Provider Notes (Signed)
[facility] EMERGENCY DEPARTMENT Provider Note   CSN: [id number] Arrival date & time: 12/20/18  [id number]     History   Chief Complaint Chief Complaint  Patient presents with  . Neck Pain    HPI Amanda Barker is a 28 y.o. female who presents the emergency department with a chief complaint of neck pain.  Patient states that on 11/27/2018 she was practicing a handstand when her arms gave out and she fell putting axial load on her head with her face toward her right shoulder.  She had immediate severe pain in the neck and had several days of acute torticollis.  She has had continued neck stiffness and pain and saw a chiropractor at the recommendation of a friend who took neck films that showed a step-off at about the C4-C5 vertebra.  Her primary care doctor thus sent her to the ER for emergent MRI.  She states that she is able to turn her neck however her ROM is still limited. She denies any upper extremity weakness or paresthesia.     HPI  Past Medical History:  Diagnosis Date  . Elevated blood pressure affecting pregnancy in third trimester, antepartum 10/05/2014  . GERD (gastroesophageal reflux disease)   . Postpartum care following vaginal delivery (8/24) 10/23/2014  . SVT (supraventricular tachycardia) ([place]) 11/2009  . SVT (supraventricular tachycardia) ([place]) 11/2009  . Urinary tract infection, site not specified     Patient Active Problem List   Diagnosis Date Noted  . Postpartum care following vaginal delivery (8/24) 10/23/2014  . Pregnancy 10/22/2014  . Elevated blood pressure affecting pregnancy in third trimester, antepartum 10/05/2014  . Nephrolithiasis 09/20/2010  . Urinary tract infection 09/20/2010  . SVT (supraventricular tachycardia) ([facility])     Past Surgical History:  Procedure Laterality Date  . ADENOIDECTOMY    . SHOULDER ARTHROSCOPY  12/2005     OB History    Gravida  2   Para  2   Term  2   Preterm  0   AB  0   Living  2      SAB  0   TAB  0   Ectopic  0   Multiple  0   Live Births  2            Home Medications    Prior to Admission medications   Medication Sig Start Date End Date Taking? Authorizing Provider  citalopram (CELEXA) 10 MG tablet Take 10 mg by mouth at bedtime.   Yes [provider]    Family History Family History  Problem Relation Age of Onset  . Hypertension Paternal Grandmother     Social History Social History   Tobacco Use  . Smoking status: Never Smoker  . Smokeless tobacco: Never Used  Substance Use Topics  . Alcohol use: No  . Drug use: No     Allergies   Sulfa antibiotics   Review of Systems Review of Systems Ten systems reviewed and are negative for acute change, except as noted in the HPI.   Physical Exam Updated Vital Signs BP 126/86 (BP Location: Right Arm)   Pulse (!) 16   Temp 98.7 F (37.1 C) (Oral)   Resp 16   Ht 5\' 5"  (1.651 m)   Wt 72.6 kg   SpO2 98%   BMI 26.63 kg/m   Physical Exam Vitals signs and nursing note reviewed.  Constitutional:      General: She is not in acute distress.  Appearance: She is well-developed. She is not diaphoretic.  HENT:     Head: Normocephalic and atraumatic.  Eyes:     General: No scleral icterus.    Conjunctiva/sclera: Conjunctivae normal.  Neck:     Musculoskeletal: Decreased range of motion. Pain with movement present. No neck rigidity, spinous process tenderness or muscular tenderness.     Comments: Patient without midline tenderness.  Pain with rightward movement, limited range of motion due to pain.  Paraspinal cervical tenderness noted Cardiovascular:     Rate and Rhythm: Normal rate and regular rhythm.     Heart sounds: Normal heart sounds. No murmur. No friction rub. No gallop.   Pulmonary:     Effort: Pulmonary effort is normal. No respiratory distress.     Breath sounds: Normal breath sounds.  Abdominal:     General: Bowel sounds are normal. There is no distension.      Palpations: Abdomen is soft. There is no mass.     Tenderness: There is no abdominal tenderness. There is no guarding.  Skin:    General: Skin is warm and dry.  Neurological:     Mental Status: She is alert and oriented to person, place, and time.  Psychiatric:        Behavior: Behavior normal.        ED Treatments / Results  Labs (all labs ordered are listed, but only abnormal results are displayed) Labs Reviewed - No data to display  EKG None  Radiology No results found.  Procedures Procedures (including critical care time)  Medications Ordered in ED Medications - No data to display   Initial Impression / Assessment and Plan / ED Course  I have reviewed the triage vital signs and the nursing notes.  Pertinent labs & imaging results that were available during my care of the patient were reviewed by me and considered in my medical decision making (see chart for details).        Patient here sent in by PCP for MRI of the neck after abnormal x-ray imaging and nearly 1 month of neck pain with limited range of motion.  I personally reviewed the MRI C-spine which by my interpretation shows no evidence of ligamentous instability, fracture or other acute abnormality.  Patient will be discharged with Mobic and Robaxin as needed for pain relief.  Discussed outpatient follow-up.  Patient for discharge at this time  Final Clinical Impressions(s) / ED Diagnoses   Final diagnoses:  Acute strain of neck muscle, initial encounter  Torticollis    ED Discharge Orders         Ordered    meloxicam (MOBIC) 15 MG tablet  Daily     12/20/18 1046    methocarbamol (ROBAXIN) 500 MG tablet  3 times daily PRN     12/20/18 1046           Margarita Mail, PA-C 12/20/18 1805    Charlesetta Shanks, MD 12/23/18 [phone]

## 2018-12-20 NOTE — Discharge Instructions (Signed)
Get help right away if: You have severe pain. You develop numbness, tingling, or weakness in any part of your body. You cannot move a part of your body (you have paralysis). You have neck pain along with: Severe dizziness. Headache.

## 2018-12-20 NOTE — ED Notes (Signed)
Pt A&Ox4, ambulatory at d/c with independent steady gait, NAD. Pt declined wheelchair.

## 2018-12-20 NOTE — ED Triage Notes (Signed)
Pt sent here by her pcp for an MRI.  Was attempting a hand stand 3 weeks ago and landed on head.  X-ray showed step-off.  States some tingling radiating to L shoulder.

## 2019-01-08 ENCOUNTER — Other Ambulatory Visit: Payer: 59

## 2019-03-29 ENCOUNTER — Other Ambulatory Visit (HOSPITAL_COMMUNITY): Payer: Self-pay | Admitting: Obstetrics and Gynecology

## 2019-03-29 MED FILL — CITALOPRAM HBR 10 MG TABLET: 10 | 90 days supply | Qty: 90 | Fill #0

## 2019-05-20 ENCOUNTER — Other Ambulatory Visit: Payer: Self-pay

## 2019-05-20 ENCOUNTER — Emergency Department (HOSPITAL_COMMUNITY)
Admission: EM | Admit: 2019-05-20 | Discharge: 2019-05-20 | Disposition: A | Discharge status: Home / Self Care | Payer: No Typology Code available for payment source | Attending: Emergency Medicine | Admitting: Emergency Medicine

## 2019-05-20 ENCOUNTER — Emergency Department (HOSPITAL_COMMUNITY): Payer: No Typology Code available for payment source

## 2019-05-20 ENCOUNTER — Encounter (HOSPITAL_COMMUNITY): Payer: Self-pay | Admitting: Emergency Medicine

## 2019-05-20 DIAGNOSIS — Y999 Unspecified external cause status: Secondary | ICD-10-CM | POA: Insufficient documentation

## 2019-05-20 DIAGNOSIS — Y939 Activity, unspecified: Secondary | ICD-10-CM | POA: Diagnosis not present

## 2019-05-20 DIAGNOSIS — M5412 Radiculopathy, cervical region: Secondary | ICD-10-CM | POA: Insufficient documentation

## 2019-05-20 DIAGNOSIS — S161XXA Strain of muscle, fascia and tendon at neck level, initial encounter: Secondary | ICD-10-CM | POA: Insufficient documentation

## 2019-05-20 DIAGNOSIS — X58XXXA Exposure to other specified factors, initial encounter: Secondary | ICD-10-CM | POA: Insufficient documentation

## 2019-05-20 DIAGNOSIS — Y929 Unspecified place or not applicable: Secondary | ICD-10-CM | POA: Diagnosis not present

## 2019-05-20 DIAGNOSIS — M542 Cervicalgia: Secondary | ICD-10-CM | POA: Diagnosis present

## 2019-05-20 MED ORDER — METHOCARBAMOL 500 MG PO TABS
500.0000 mg | ORAL_TABLET | Freq: Once | ORAL | Status: AC
  Administered 2019-05-20: 500 mg via ORAL
  Filled 2019-05-20: qty 1

## 2019-05-20 MED ORDER — HYDROMORPHONE HCL 1 MG/ML IJ SOLN
0.5000 mg | Freq: Once | INTRAMUSCULAR | Status: AC
  Administered 2019-05-20: 0.5 mg via INTRAVENOUS
  Filled 2019-05-20: qty 1

## 2019-05-20 MED ORDER — METHOCARBAMOL 500 MG PO TABS: 500.0000 mg | ORAL_TABLET | Freq: Two times a day (BID) | ORAL | 0 refills | Status: AC

## 2019-05-20 MED ORDER — PREDNISONE 20 MG PO TABS
60.0000 mg | ORAL_TABLET | Freq: Once | ORAL | Status: AC
  Administered 2019-05-20: 60 mg via ORAL
  Filled 2019-05-20: qty 3

## 2019-05-20 MED ORDER — PREDNISONE 10 MG (21) PO TBPK: ORAL_TABLET | Freq: Every day | ORAL | 0 refills | Status: AC

## 2019-05-20 NOTE — ED Provider Notes (Signed)
[facility] Provider Note   CSN: [id number] Arrival date & time: 05/20/19  [id number]     History Chief Complaint  Patient presents with   Neck Pain    Amanda Barker is a 29 y.o. 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  Pain is worse with certain movements.  Has had intermittent pain associated with this bulging disc since September 2020.  She has been taking NSAIDs with only minimal improvement in her symptoms.  States that her son was admitted to the hospital and she slept on a couch 2 nights ago.  She felt this exacerbated her pain but today while at work she started experiencing paresthesias in her first 3 digits of her right hand.  This is new for her.  Still only minimal improvement noted with NSAIDs.  She reports "feeling like I hit my funny bone or my fingers fell asleep" denies any wrist pain or trauma, injuries or falls.  She denies any prior neck surgeries, fever, neck stiffness, rashes, chest pain, shortness of breath, vision changes, headache. Patient is not currently breast-feeding.  HPI     Past Medical History:  Diagnosis Date   Elevated blood pressure affecting pregnancy in third trimester, antepartum 10/05/2014   GERD (gastroesophageal reflux disease)    Postpartum care following vaginal delivery (8/24) 10/23/2014   SVT (supraventricular tachycardia) ([place]) 11/2009   SVT (supraventricular tachycardia) ([place]) 11/2009   Urinary tract infection, site not specified     Patient Active Problem List   Diagnosis Date Noted   Postpartum care following vaginal delivery (8/24) 10/23/2014   Pregnancy 10/22/2014   Elevated blood pressure affecting pregnancy in third trimester, antepartum 10/05/2014   Nephrolithiasis 09/20/2010   Urinary tract infection 09/20/2010   SVT  (supraventricular tachycardia) ([facility])     Past Surgical History:  Procedure Laterality Date   ADENOIDECTOMY     SHOULDER ARTHROSCOPY  12/2005     OB History    Gravida  2   Para  2   Term  2   Preterm  0   AB  0   Living  2     SAB  0   TAB  0   Ectopic  0   Multiple  0   Live Births  2           Family History  Problem Relation Age of Onset   Hypertension Paternal Grandmother     Social History   Tobacco Use   Smoking status: Never Smoker   Smokeless tobacco: Never Used  Substance Use Topics   Alcohol use: No   Drug use: No    Home Medications Prior to Admission medications   Medication Sig Start Date End Date Taking? Authorizing Provider  acetaminophen (TYLENOL) 500 MG tablet Take 1,000 mg by mouth every 4 (four) hours as needed for mild pain.   Yes [provider]  citalopram (CELEXA) 10 MG tablet Take 10 mg by mouth at bedtime.   Yes [provider]  ibuprofen (ADVIL) 200 MG tablet Take 600-800 mg by mouth every 6 (six) hours as needed for moderate pain.   Yes [provider]  phentermine (ADIPEX-P) 37.5 MG tablet Take 18.75 mg by mouth daily before breakfast.   Yes [provider]  methocarbamol (ROBAXIN) 500 MG tablet Take 1 tablet (500  mg total) by mouth 2 (two) times daily. 05/20/19   Minha Fulco, PA-C  predniSONE (STERAPRED UNI-PAK 21 TAB) 10 MG (21) TBPK tablet Take by mouth daily. Take 6 tabs by mouth daily  for 2 days, then 5 tabs for 2 days, then 4 tabs for 2 days, then 3 tabs for 2 days, 2 tabs for 2 days, then 1 tab by mouth daily for 2 days 05/20/19   Delia Heady, PA-C    Allergies    Sulfa antibiotics  Review of Systems   Review of Systems  Constitutional: Negative for appetite change, chills and fever.  HENT: Negative for ear pain, rhinorrhea, sneezing and sore throat.   Eyes: Negative for photophobia and visual disturbance.  Respiratory: Negative for cough, chest tightness, shortness  of breath and wheezing.   Cardiovascular: Negative for chest pain and palpitations.  Gastrointestinal: Negative for abdominal pain, blood in stool, constipation, diarrhea, nausea and vomiting.  Genitourinary: Negative for dysuria, hematuria and urgency.  Musculoskeletal: Positive for myalgias and neck pain.  Skin: Negative for rash.  Neurological: Negative for dizziness, weakness and light-headedness.       +paresthesias    Physical Exam Updated Vital Signs BP 119/64 (BP Location: Left Arm)    Pulse 69    Temp 98.9 F (37.2 C) (Oral)    Resp 18    SpO2 100%   Physical Exam Vitals and nursing note reviewed.  Constitutional:      General: She is not in acute distress.    Appearance: She is well-developed.  HENT:     Head: Normocephalic and atraumatic.     Nose: Nose normal.  Eyes:     General: No scleral icterus.       Right eye: No discharge.        Left eye: No discharge.     Conjunctiva/sclera: Conjunctivae normal.  Cardiovascular:     Rate and Rhythm: Normal rate and regular rhythm.     Heart sounds: Normal heart sounds. No murmur. No friction rub. No gallop.   Pulmonary:     Effort: Pulmonary effort is normal. No respiratory distress.     Breath sounds: Normal breath sounds.  Abdominal:     General: Bowel sounds are normal. There is no distension.     Palpations: Abdomen is soft.     Tenderness: There is no abdominal tenderness. There is no guarding.  Musculoskeletal:        General: Normal range of motion.     Cervical back: Normal range of motion and neck supple.     Comments: No midline spinal tenderness present in cervical spine. No step-off palpated. No visible bruising, edema or temperature change noted. No objective signs of numbness present.  Skin:    General: Skin is warm and dry.     Findings: No rash.  Neurological:     General: No focal deficit present.     Mental Status: She is alert and oriented to person, place, and time.     Cranial Nerves: No  cranial nerve deficit.     Sensory: No sensory deficit.     Motor: Weakness present. No abnormal muscle tone.     Coordination: Coordination normal.     Comments: Slight weakness with grip noted of the right upper extremity 2/2 pain without any changes in sensation. Pupils reactive. No facial asymmetry noted. Cranial nerves appear grossly intact. Sensation intact to light touch on face, BUE and BLE.      ED Results /  Procedures / Treatments   Labs (all labs ordered are listed, but only abnormal results are displayed) Labs Reviewed - No data to display  EKG None  Radiology MR Cervical Spine Wo Contrast  Result Date: 05/20/2019 CLINICAL DATA:  Neck pain with right hand numbness EXAM: MRI CERVICAL SPINE WITHOUT CONTRAST TECHNIQUE: Multiplanar, multisequence MR imaging of the cervical spine was performed. No intravenous contrast was administered. COMPARISON:  12/20/2018 FINDINGS: Alignment: Physiologic. Vertebrae: No fracture, evidence of discitis, or bone lesion. Cord: Normal signal and morphology. Posterior Fossa, vertebral arteries, paraspinal tissues: Developmentally diminutive left vertebral artery. No acute findings. Disc levels: C2-C3: No significant disc protrusion, foraminal stenosis, or canal stenosis. C3-C4: No significant disc protrusion, foraminal stenosis, or canal stenosis. C4-C5: No significant disc protrusion, foraminal stenosis, or canal stenosis. C5-C6: Minimal posterior disc osteophyte complex and mild right greater than left uncovertebral spurring resulting in borderline-mild right foraminal narrowing without evidence of neural impingement. No canal stenosis. No appreciable progression from prior. C6-C7: No significant disc protrusion, foraminal stenosis, or canal stenosis. C7-T1: No significant disc protrusion, foraminal stenosis, or canal stenosis. IMPRESSION: 1. No acute osseous abnormality or abnormal cord signal. 2. Minimal degenerative changes at C5-C6 result in  borderline-mild right foraminal narrowing without evidence of neural impingement. 3. No significant foraminal or canal stenosis of the cervical spine. Electronically Signed   By: Davina Poke D.O.   On: 05/20/2019 13:22    Procedures Procedures (including critical care time)  Medications Ordered in ED Medications  methocarbamol (ROBAXIN) tablet 500 mg (500 mg Oral Given 05/20/19 1042)  HYDROmorphone (DILAUDID) injection 0.5 mg (0.5 mg Intravenous Given 05/20/19 1130)  predniSONE (DELTASONE) tablet 60 mg (60 mg Oral Given 05/20/19 1042)    ED Course  I have reviewed the triage vital signs and the nursing notes.  Pertinent labs & imaging results that were available during my care of the patient were reviewed by me and considered in my medical decision making (see chart for details).    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  She has been sleeping on a couch 2 nights ago and believes this caused her worsening pain although the paresthesias are new for her.  On her exam there is no midline tenderness, limited range of motion secondary to pain.  No meningeal signs noted.  No change sensation noted.  Slight weakness of the right upper extremity secondary to pain.  MRI today shows no acute findings, there are some degenerative changes at C5-C6 without any canal stenosis.  Patient was given prednisone, Dilaudid and Robaxin with significant improvement in her symptoms.  Repeat exam show normal strength and sensation as well as improvement in movement of neck.  Suspect that this could be due to a muscle strain.  I doubt infectious cause of her symptoms such as meningitis.  Patient is comfortable with discharge home with remainder of steroid course, muscle relaxer,  specialist follow-up.  Return precautions given.  Patient is hemodynamically stable, in NAD. Evaluation does not show pathology that would require ongoing emergent intervention or inpatient treatment. I have personally reviewed and interpreted all lab work and imaging at today's ED visit. I explained the diagnosis to the patient.  Pain has been managed and has no complaints prior to discharge. Patient is comfortable with above plan and is stable for discharge at this time. All questions were answered prior to disposition. Strict return precautions for returning to the ED were discussed. Encouraged follow up with PCP.   An After Visit Summary was printed and given to the patient.   Portions of this note were generated with [employer]. Dictation errors may occur despite best attempts at proofreading.  Final Clinical Impression(s) / ED Diagnoses Final diagnoses:  Cervical radiculopathy  Acute strain of neck muscle, initial encounter    Rx / DC Orders ED Discharge Orders         Ordered    predniSONE (STERAPRED UNI-PAK 21 TAB) 10 MG (21) TBPK tablet  Daily     05/20/19 1419    methocarbamol (ROBAXIN) 500 MG tablet  2 times daily     05/20/19 1419           Delia Heady, PA-C 05/20/19 1424    Fredia Sorrow, MD 05/21/19 [phone]

## 2019-05-20 NOTE — Discharge Instructions (Signed)
Take the rest of the course of the steroids beginning tomorrow. You can take the Robaxin as needed to help with spasms and stiffness. Continue range of motion exercises to prevent worsening stiffness. Return to the ER if you start to experience worsening pain, numbness, injuries or falls, chest pain or shortness of breath.

## 2019-05-20 NOTE — ED Notes (Signed)
Pt's IV removed from the right AC. IV site was clean, dry, and intact. Pt's IV removed, per William Hamburger - RN.

## 2019-05-20 NOTE — ED Notes (Signed)
Patient stated would like to hold off on IV pain medication and try the PO meds first, hot pack provided as well.

## 2019-05-20 NOTE — ED Triage Notes (Signed)
Hx of C5-C6 bulging disc.  Slept in chair 2 nights ago because son was admitted to hospital.  C/o neck pain with numbness to right 2nd and 3rd digits.

## 2019-06-21 MED FILL — CITALOPRAM HBR 10 MG TABLET: 10 | 90 days supply | Qty: 90 | Fill #0

## 2019-06-22 ENCOUNTER — Other Ambulatory Visit: Payer: Self-pay

## 2019-06-22 ENCOUNTER — Ambulatory Visit: Payer: No Typology Code available for payment source | Attending: Neurosurgery | Admitting: Physical Therapy

## 2019-06-22 DIAGNOSIS — M542 Cervicalgia: Secondary | ICD-10-CM | POA: Diagnosis not present

## 2019-06-22 DIAGNOSIS — R293 Abnormal posture: Secondary | ICD-10-CM | POA: Insufficient documentation

## 2019-06-22 DIAGNOSIS — M6281 Muscle weakness (generalized): Secondary | ICD-10-CM | POA: Insufficient documentation

## 2019-06-22 DIAGNOSIS — M5412 Radiculopathy, cervical region: Secondary | ICD-10-CM | POA: Diagnosis present

## 2019-06-22 NOTE — Therapy (Signed)
[place], [place], [address] Phone: [phone]   Fax:  [phone]  Physical Therapy Evaluation  Patient Details  Name: Amanda Barker MRN: [id number] Date of Birth: [birth year] Referring Provider (PT): Kary Kos, MD   Encounter Date: 06/22/2019  PT End of Session - 06/22/19 0841    Visit Number  1    Number of Visits  8    Date for PT Re-Evaluation  08/17/19    Authorization Type  [facility] [employer]    PT Start Time  0830    PT Stop Time  0915    PT Time Calculation (min)  45 min    Activity Tolerance  Patient tolerated treatment well    Behavior During Therapy  [facility] for tasks assessed/performed       Past Medical History:  Diagnosis Date  . Elevated blood pressure affecting pregnancy in third trimester, antepartum 10/05/2014  . GERD (gastroesophageal reflux disease)   . Postpartum care following vaginal delivery (8/24) 10/23/2014  . SVT (supraventricular tachycardia) ([place]) 11/2009  . SVT (supraventricular tachycardia) ([place]) 11/2009  . Urinary tract infection, site not specified     Past Surgical History:  Procedure Laterality Date  . ADENOIDECTOMY    . SHOULDER ARTHROSCOPY  12/2005    There were no vitals filed for this visit.   Subjective Assessment - 06/22/19 0832    Subjective  Patient reports back in September she had fall on her neck while performing a hand stand and came down on her neck in flexion. 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 She gets sharp pain down her neck bilaterally, with aching down her right upper back and she did have parasthesias in 1st 2 digits which only happened once. She has tried medication without much relief, she has not tried any exercises. Her left sided neck pain seems more muscular while right sides seems more nerve related.    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  No limitation    How long can you stand comfortably?  No limitation    How long can you walk comfortably?  No limitation    Diagnostic tests  MRI - Minimal degenerative changes at C5-C6 result in borderline-mild right foraminal narrowing without evidence of neural impingement.    Patient Stated Goals  Get rid of neck pain so she can return to previous level of function    Currently in Pain?  Yes    Pain Score  2     Pain Location  Neck    Pain Orientation  Right;Left   L > R   Pain Descriptors / Indicators  Tightness;Sore;Sharp   right sided sharpness, left sided sore/tight   Pain Type  Chronic pain    Pain Radiating Towards  right upper back in between shoulder blade and spine    Pain Onset  More than a month ago    Pain Frequency  Constant    Aggravating Factors   Turning head or bending head to side, lifting    Pain Relieving Factors  Massage gun, medication, heat, over-the-door traction    Effect of Pain on Daily Activities  Patient feels limited while working to move patients         [facility] PT Assessment - 06/22/19 0001      Assessment   Medical  Diagnosis  Radiculopathy, cervical region    Referring Provider (PT)  Kary Kos, MD    Onset Date/Surgical Date  --   September 2020   Hand Dominance  Right    Next MD Visit  ~4 weeks    Prior Therapy  Yes - left shoulder surgery in high school, 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  1 - while trying to do a hand stand    Has the patient had a decrease in activity level because of a fear of falling?   No    Is the patient reluctant to leave their home because of a fear of falling?   No      Home Film/video editor residence    Living Arrangements  Spouse/significant  other;Children      Prior Function   Level of Independence  Independent    Vocation  Full time employment    Occupational psychologist in labor and delivery at [employer] so spend a lot of time on phone, time with kids      Cognition   Overall Cognitive Status  Within Functional Limits for tasks assessed      Observation/Other Assessments   Observations  Patient appears in no apparent distress    Focus on Therapeutic Outcomes (FOTO)   50% limitation   predicted 37% limitation     Sensation   Light Touch  Appears Intact      Posture/Postural Control   Posture Comments  Patient exhibits slight forward head and rounded shoulder posture, slight cervical side bend left      ROM / Strength   AROM / PROM / Strength  AROM;PROM;Strength      AROM   Overall AROM Comments  Shoulder AROM grossly WFL and non painful    AROM Assessment Site  Shoulder;Cervical    Cervical Flexion  30    Cervical Extension  35   left sided cervical pain   Cervical - Right Side Bend  20   left sided cervical pain   Cervical - Left Side Bend  45    Cervical - Right Rotation  65   left sided cervical pain   Cervical - Left Rotation  70      PROM   PROM Assessment Site  Cervical    Cervical Flexion  --   left side paraspinal tightness   Cervical - Right Side Bend  --   left sided tightness of levator and upper trap   Cervical - Right Rotation  --   left sided tightness paraspinals and levator     Strength   Overall Strength Comments  Periscapular strength grossly 4-/5 MMT bilaterally, no pain with cervical isometric testing    Strength Assessment Site  Shoulder    Right/Left Shoulder  Right;Left    Right Shoulder Flexion  5/5    Right Shoulder Extension  4+/5    Right Shoulder ABduction  5/5    Right Shoulder Internal Rotation  5/5    Right Shoulder External Rotation  4+/5    Right Shoulder Horizontal ABduction  4/5    Left Shoulder Flexion  5/5    Left Shoulder  Extension  4+/5    Left Shoulder ABduction  5/5    Left Shoulder Internal Rotation  5/5    Left Shoulder External Rotation  5/5    Left Shoulder Horizontal ABduction  4/5      Flexibility   Soft Tissue Assessment /Muscle Length  yes   L > R levator and upper trap tightness     Palpation   Spinal mobility  Not assesed this visit    Palpation comment  TTP wth palpable trigger points of left cerival paraspinals and levator/upper trap region, right levator region      Special Tests    Special Tests  Cervical    Other special tests  Median nerve neurodynamic testing positive for reproduction of right arm/hand symptoms, mildly positive with radial nerve, negative with ulnar nerve    Cervical Tests  Dictraction;Spurling's      Spurling's   Findings  Positive    Comment  no radicular pain to right arm, patient reports right sided neck pain      Distraction Test   Findngs  Positive    Comment  patient reports improvement in symptoms      Transfers   Transfers  Independent with all Transfers                Objective measurements completed on examination: See above findings.      [facility] Adult PT Treatment/Exercise - 06/22/19 0001      Exercises   Exercises  Neck      Neck Exercises: Theraband   Rows  10 reps;Red    Shoulder External Rotation  10 reps;Red    Shoulder External Rotation Limitations  bilat with scap retraction      Neck Exercises: Standing   Other Standing Exercises  Median nerve flos/tension with arm by side x10      Neck Exercises: Seated   Neck Retraction  10 reps;3 secs      Manual Therapy   Manual Therapy  Soft tissue mobilization    Soft tissue mobilization  Skilled palpation for TPDN      Neck Exercises: Stretches   Levator Stretch  20 seconds       Trigger Point Dry Needling - 06/22/19 0001    Consent Given?  Yes    Education Handout Provided  No    Muscles Treated Head and Neck  Upper trapezius;Levator scapulae   left side in prone  position   Dry Needling Comments  Patient reported lightheadedness and feeling of faitness so she was instructed to reposition supine, legs elevated and patient given water and cold pack, after ~3-4 minutes she stated she felt better and was able to sit up with no symptoms    Upper Trapezius Response  Twitch reponse elicited;Palpable increased muscle length    Levator Scapulae Response  Twitch response elicited;Palpable increased muscle length      TPDN performed by Carlus Pavlov PT,DPT     PT Education - 06/22/19 0841    Education Details  Exam findings, POC, HEP, TPDN    Person(s) Educated  Patient    Methods  Explanation;Demonstration;Tactile cues;Verbal cues;Handout    Comprehension  Verbalized understanding;Returned demonstration;Verbal cues required;Tactile cues required;Need further instruction       PT Short Term Goals - 06/22/19 1214      PT SHORT TERM GOAL #1   Title  Patient will be I with intial HEP to progress in PT    Time  4    Period  Weeks    Status  New    Target Date  07/20/19      PT SHORT TERM GOAL #2   Title  Patient will report resting pain level 0-1/10 to have no limitations at work    Time  4    Period  Weeks    Status  New    Target Date  07/20/19      PT SHORT TERM GOAL #3   Title  Patient will exhibit cervical AROM WFL and equal bilaterally with </= 1-2/10 pain level to improve ability to look over shoulder    Time  4    Period  Weeks    Status  New    Target Date  07/20/19        PT Long Term Goals - 06/22/19 1217      PT LONG TERM GOAL #1   Title  Patient will be I with final HEP to maintain progress from PT    Time  8    Period  Weeks    Status  New    Target Date  08/17/19      PT LONG TERM GOAL #2   Title  Patient will exhibit gross shoulder and periscapular strength >/= 4+/5 MMT so 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    Time  8    Period  Weeks    Status  New    Target Date  08/17/19      PT  LONG TERM GOAL #3   Title  Patient will report improved functional level to </= 37% limitation on FOTO to indicate return to prior level of function    Time  8    Period  Weeks    Status  New    Target Date  08/17/19      PT LONG TERM GOAL #4   Title  Patient will have no limitations with looking at phone and will exhibit improved seated posture without deficits    Time  8    Period  Weeks    Status  New    Target Date  08/17/19             Plan - 06/22/19 1203    Clinical Impression Statement  Patient presents to PT with report of chronic bilateral neck pain that will refer pain to right medial shoulder blade region and one occasion of right hand 1-2 digit parasthesias. She exhibits limitations in her cervical range of motion, postural strength and endurance, muscle flexibility, activity tolerance with lifting task, and pain. Her left sided neck pain seems consistent with more muscular related pain as she exhibits pain with palpation of trigger points and improvement following TPDN. The right side also has a muscular component but she does also exhibit radicular symptoms on the right with testing. She responded well to [facility] but did have adverse reaction where she felt faint, however this is common for the patients with needles, and she was provided with exercises to address impairments. She would benefit from continued skilled PT to reduce muscular tension and improve motion and strength in order to relieve pain and allow her to return to previous level of function without limitation or pain.    Personal Factors and Comorbidities  Time since onset of injury/illness/exacerbation    Examination-Activity Limitations  Lift;Carry    Examination-Participation Restrictions  Community Activity;Cleaning;Driving;Laundry;Shop    Stability/Clinical Decision Making  Stable/Uncomplicated  Clinical Decision Making  Low    Rehab Potential  Good    PT Frequency  1x / week    PT Duration  8 weeks     PT Treatment/Interventions  ADLs/Self Care Home Management;Cryotherapy;Electrical Stimulation;Iontophoresis 4mg /ml Dexamethasone;Moist Heat;Traction;Ultrasound;Therapeutic exercise;Therapeutic activities;Neuromuscular re-education;Patient/family education;Manual techniques;Dry needling;Passive range of motion;Taping;Spinal Manipulations;Joint Manipulations    PT Next Visit Plan  Assess HEP and progress PRN, assess response to TPDN, assess cervical spine mobility and perform mobs/manual PRN, progress postural strengthening and neurodynamics    PT Home Exercise Plan  [email or web address]: seated chin tuck, levator scap stretch, median nerve floss/tensioner, row and ER/scap retraction with red    Consulted and Agree with Plan of Care  Patient       Patient will benefit from skilled therapeutic intervention in order to improve the following deficits and impairments:  Decreased range of motion, Postural dysfunction, Decreased strength, Decreased endurance, Decreased activity tolerance, Pain, Impaired flexibility  Visit Diagnosis: Cervicalgia  Radiculopathy, cervical region  Muscle weakness (generalized)  Abnormal posture     Problem List Patient Active Problem List   Diagnosis Date Noted  . Postpartum care following vaginal delivery (8/24) 10/23/2014  . Pregnancy 10/22/2014  . Elevated blood pressure affecting pregnancy in third trimester, antepartum 10/05/2014  . Nephrolithiasis 09/20/2010  . Urinary tract infection 09/20/2010  . SVT (supraventricular tachycardia) ([place])     Hilda Blades, PT, DPT, LAT, ATC 06/22/19  12:26 PM Phone: [phone] Fax: [place] [facility] [address] [place], [place], [address] Phone: [phone]   Fax:  [phone]  Name: CANSAS ELL MRN: [id number] Date of Birth: Jul 14, 1990

## 2019-06-22 NOTE — Patient Instructions (Signed)
Access Code: [email or web address] URL: [email or web address] Date: 06/22/2019 Prepared by: Hilda Blades  Exercises Seated Cervical Retraction - 3 x daily - 7 x weekly - 10 reps - 3 seconds hold Median Nerve Tensioner - 3 x daily - 7 x weekly - 10 reps Gentle Levator Scapulae Stretch - 3 x daily - 7 x weekly - 15-20 seconds hold Banded Row - 1 x daily - 7 x weekly - 2 sets - 15 reps Shoulder External Rotation and Scapular Retraction with Resistance - 1 x daily - 7 x weekly - 2 sets - 15 reps

## 2019-07-03 ENCOUNTER — Encounter: Payer: Self-pay | Admitting: Physical Therapy

## 2019-07-03 ENCOUNTER — Ambulatory Visit: Payer: No Typology Code available for payment source | Attending: Neurosurgery | Admitting: Physical Therapy

## 2019-07-03 ENCOUNTER — Other Ambulatory Visit: Payer: Self-pay

## 2019-07-03 DIAGNOSIS — M5412 Radiculopathy, cervical region: Secondary | ICD-10-CM | POA: Insufficient documentation

## 2019-07-03 DIAGNOSIS — R293 Abnormal posture: Secondary | ICD-10-CM | POA: Insufficient documentation

## 2019-07-03 DIAGNOSIS — M6281 Muscle weakness (generalized): Secondary | ICD-10-CM | POA: Diagnosis present

## 2019-07-03 DIAGNOSIS — M542 Cervicalgia: Secondary | ICD-10-CM | POA: Insufficient documentation

## 2019-07-03 NOTE — Therapy (Addendum)
[place], [place], [address] Phone: [phone]   Fax:  [phone]  Physical Therapy Treatment / Discharge  Patient Details  Name: Amanda Barker MRN: [id number] Date of Birth: 02-Feb-1991 Referring Provider (PT): Kary Kos, MD   Encounter Date: 07/03/2019  PT End of Session - 07/03/19 1217    Visit Number  2    Number of Visits  8    Date for PT Re-Evaluation  08/17/19    Authorization Type  [facility] [employer]    PT Start Time  1216    PT Stop Time  1258    PT Time Calculation (min)  42 min    Activity Tolerance  Patient tolerated treatment well    Behavior During Therapy  [facility] for tasks assessed/performed       Past Medical History:  Diagnosis Date  . Elevated blood pressure affecting pregnancy in third trimester, antepartum 10/05/2014  . GERD (gastroesophageal reflux disease)   . Postpartum care following vaginal delivery (8/24) 10/23/2014  . SVT (supraventricular tachycardia) ([place]) 11/2009  . SVT (supraventricular tachycardia) ([place]) 11/2009  . Urinary tract infection, site not specified     Past Surgical History:  Procedure Laterality Date  . ADENOIDECTOMY    . SHOULDER ARTHROSCOPY  12/2005    There were no vitals filed for this visit.  Subjective Assessment - 07/03/19 1217    Subjective  Patient reports she was feeling pretty good and then Sunday she was called into work and had a difficulty patient who aggravated her neck. She has been super tight since then. States she has had no parasthesia into the right hand, the pain radiating to right scapular region has been pretty consistent.    Patient Stated Goals  Get rid of neck pain so she can return to previous level of function    Currently in Pain?  Yes    Pain Score  3     Pain Location  Neck    Pain Orientation  Right;Left   L > R   Pain Descriptors / Indicators  Tightness    Pain Type  Chronic pain    Pain Radiating Towards  right upper  back in between shoulder blade and spine    Pain Onset  More than a month ago    Pain Frequency  Constant         [facility] PT Assessment - 07/03/19 0001      Flexibility   Soft Tissue Assessment /Muscle Length  --   L > R levator scap tightness                  [facility] Adult PT Treatment/Exercise - 07/03/19 0001      Exercises   Exercises  Neck      Neck Exercises: Machines for Strengthening   UBE (Upper Arm Bike)  L3 x 4 min (fwd/bwd)      Neck Exercises: Theraband   Rows  20 reps;Green      Neck Exercises: Seated   Neck Retraction  10 reps;3 secs    Other Seated Exercise  Thoracic extension with hands behind neck and elbows wide and close      Neck Exercises: Sidelying   Other Sidelying Exercise  Thoracic rotation stretch      Manual Therapy   Manual Therapy  Soft tissue mobilization;Passive ROM    Soft tissue mobilization  Suboccipital release with gentle manual traction    Passive ROM  Upper trap and  levator scap stretching      Neck Exercises: Stretches   Chest Stretch  3 reps;20 seconds    Chest Stretch Limitations  doorway with arms at 90 deg    Other Neck Stretches  Supine suboccipital SMFR using peanut             PT Education - 07/03/19 1216    Education Details  HEP    Person(s) Educated  Patient    Methods  Explanation;Demonstration;Tactile cues;Verbal cues    Comprehension  Verbalized understanding;Returned demonstration;Verbal cues required;Tactile cues required;Need further instruction       PT Short Term Goals - 06/22/19 1214      PT SHORT TERM GOAL #1   Title  Patient will be I with intial HEP to progress in PT    Time  4    Period  Weeks    Status  New    Target Date  07/20/19      PT SHORT TERM GOAL #2   Title  Patient will report resting pain level 0-1/10 to have no limitations at work    Time  4    Period  Weeks    Status  New    Target Date  07/20/19      PT SHORT TERM GOAL #3   Title  Patient will exhibit  cervical AROM WFL and equal bilaterally with </= 1-2/10 pain level to improve ability to look over shoulder    Time  4    Period  Weeks    Status  New    Target Date  07/20/19        PT Long Term Goals - 06/22/19 1217      PT LONG TERM GOAL #1   Title  Patient will be I with final HEP to maintain progress from PT    Time  8    Period  Weeks    Status  New    Target Date  08/17/19      PT LONG TERM GOAL #2   Title  Patient will exhibit gross shoulder and periscapular strength >/= 4+/5 MMT so 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    Time  8    Period  Weeks    Status  New    Target Date  08/17/19      PT LONG TERM GOAL #3   Title  Patient will report improved functional level to </= 37% limitation on FOTO to indicate return to prior level of function    Time  8    Period  Weeks    Status  New    Target Date  08/17/19      PT LONG TERM GOAL #4   Title  Patient will have no limitations with looking at phone and will exhibit improved seated posture without deficits    Time  8    Period  Weeks    Status  New    Target Date  08/17/19            Plan - 07/03/19 1217    Clinical Impression Statement  Patient tolerated therapy well with no adverse effects. Therapy focused on improving mobility and posture and reducing neck tightness. She responded well to stretching and suboccipital release so was given a tennis ball peanut to perform SMFR at home which she tolerated well. She was also given thoracic mobility exercises to improve posture and mobility. She continues to exhibit greater neck tightness on  the right side that seems to be more levator scap as well as pain beween the right scap and spine that could be postural related. She would benefit from continued skilled PT to reduce muscular tension and improve motion and strength in order to relieve pain and allow her to return to previous level of function without limitation or pain.    PT  Treatment/Interventions  ADLs/Self Care Home Management;Cryotherapy;Electrical Stimulation;Iontophoresis 56m/ml Dexamethasone;Moist Heat;Traction;Ultrasound;Therapeutic exercise;Therapeutic activities;Neuromuscular re-education;Patient/family education;Manual techniques;Dry needling;Passive range of motion;Taping;Spinal Manipulations;Joint Manipulations    PT Next Visit Plan  Assess HEP and progress PRN, assess response to TPDN, assess cervical spine mobility and perform mobs/manual PRN, progress postural strengthening and neurodynamics    PT Home Exercise Plan  [email or web address]: supine suboccipital release with peanut, seated chin tuck, levator scap stretch, median nerve floss/tensioner, row and ER/scap retraction with red, seated thoracic extension, sidelying thoracic rotation, doorway pec stretch    Consulted and Agree with Plan of Care  Patient       Patient will benefit from skilled therapeutic intervention in order to improve the following deficits and impairments:  Decreased range of motion, Postural dysfunction, Decreased strength, Decreased endurance, Decreased activity tolerance, Pain, Impaired flexibility  Visit Diagnosis: Cervicalgia  Radiculopathy, cervical region  Muscle weakness (generalized)  Abnormal posture     Problem List Patient Active Problem List   Diagnosis Date Noted  . Postpartum care following vaginal delivery (8/24) 10/23/2014  . Pregnancy 10/22/2014  . Elevated blood pressure affecting pregnancy in third trimester, antepartum 10/05/2014  . Nephrolithiasis 09/20/2010  . Urinary tract infection 09/20/2010  . SVT (supraventricular tachycardia) ([place]     CHilda Blades PT, DPT, LAT, ATC 07/03/19  1:27 PM Phone: [phone]: [facility] [place] [address]Phone: [phone]  Fax:  [phone] Name: Amanda HATTERYMRN: [id number] of Birth:  01/12/1991-03-06    PHYSICAL THERAPY DISCHARGE SUMMARY  Visits from Start of Care: 2  Current functional level related to goals / functional outcomes: See above   Remaining deficits: See above   Education / Equipment: HEP  Plan: Patient agrees to discharge.  Patient goals were not met. Patient is being discharged due to not returning since the last visit.  ?????    CHilda Blades PT, DPT, LAT, ATC 09/05/19  8:18 AM Phone: [phone]: [phone]

## 2019-07-03 NOTE — Patient Instructions (Signed)
Access Code: [email or web address] URL: [email or web address] Date: 06/22/2019 Prepared by: Hilda Blades  Exercises Supine Suboccipital Release with Tennis Balls Seated Cervical Retraction - 3 x daily - 10 reps - 3 seconds hold Median Nerve Tensioner - 3 x daily - 10 reps Gentle Levator Scapulae Stretch - 3 x daily - 15-20 seconds hold Banded Row - 1 x daily - 2 sets - 15 reps Shoulder External Rotation and Scapular Retraction with Resistance - 1 x daily - 2 sets - 15 reps Doorway Pec Stretch at 90 Degrees Abduction - 3 x daily - 3 reps - 15-20 seconds hold Seated Thoracic Lumbar Extension with Pectoralis Stretch - 10-15 reps - 2-3 seconds hold Sidelying Thoracic Rotation - 10 reps - 3-5 seconds hold

## 2019-10-29 MED FILL — CITALOPRAM HBR 10 MG TABLET: 10 | 90 days supply | Qty: 90 | Fill #1

## 2019-12-28 ENCOUNTER — Other Ambulatory Visit (HOSPITAL_COMMUNITY): Payer: Self-pay | Admitting: Family Medicine

## 2019-12-28 MED FILL — VYVANSE 50 MG CAPSULE: 50 | 30 days supply | Qty: 30 | Fill #0

## 2020-03-03 DIAGNOSIS — Z20822 Contact with and (suspected) exposure to covid-19: Secondary | ICD-10-CM | POA: Diagnosis not present

## 2020-03-03 DIAGNOSIS — Z20828 Contact with and (suspected) exposure to other viral communicable diseases: Secondary | ICD-10-CM | POA: Diagnosis not present

## 2020-03-06 DIAGNOSIS — Z20822 Contact with and (suspected) exposure to covid-19: Secondary | ICD-10-CM | POA: Diagnosis not present

## 2020-03-06 DIAGNOSIS — Z20828 Contact with and (suspected) exposure to other viral communicable diseases: Secondary | ICD-10-CM | POA: Diagnosis not present

## 2020-03-12 MED FILL — CITALOPRAM HBR 10 MG TABLET: 10 | 90 days supply | Qty: 90 | Fill #2

## 2020-03-24 DIAGNOSIS — L700 Acne vulgaris: Secondary | ICD-10-CM | POA: Diagnosis not present

## 2020-04-22 ENCOUNTER — Other Ambulatory Visit (HOSPITAL_COMMUNITY): Payer: Self-pay | Admitting: Family Medicine

## 2020-04-22 MED FILL — VYVANSE 50 MG CAPSULE: 50 | 30 days supply | Qty: 30 | Fill #0

## 2020-04-24 DIAGNOSIS — Z79899 Other long term (current) drug therapy: Secondary | ICD-10-CM | POA: Diagnosis not present

## 2020-04-24 DIAGNOSIS — L700 Acne vulgaris: Secondary | ICD-10-CM | POA: Diagnosis not present

## 2020-04-24 DIAGNOSIS — E785 Hyperlipidemia, unspecified: Secondary | ICD-10-CM | POA: Diagnosis not present

## 2020-04-28 DIAGNOSIS — L700 Acne vulgaris: Secondary | ICD-10-CM | POA: Diagnosis not present

## 2020-05-21 ENCOUNTER — Other Ambulatory Visit (HOSPITAL_BASED_OUTPATIENT_CLINIC_OR_DEPARTMENT_OTHER): Payer: Self-pay

## 2020-05-26 ENCOUNTER — Other Ambulatory Visit (HOSPITAL_COMMUNITY): Payer: Self-pay | Admitting: Family Medicine

## 2020-05-26 DIAGNOSIS — L700 Acne vulgaris: Secondary | ICD-10-CM | POA: Diagnosis not present

## 2020-05-26 MED FILL — VYVANSE 50 MG CAPSULE: 50 | 30 days supply | Qty: 30 | Fill #0

## 2020-05-28 DIAGNOSIS — Z79899 Other long term (current) drug therapy: Secondary | ICD-10-CM | POA: Diagnosis not present

## 2020-05-28 DIAGNOSIS — L700 Acne vulgaris: Secondary | ICD-10-CM | POA: Diagnosis not present

## 2020-05-28 DIAGNOSIS — E785 Hyperlipidemia, unspecified: Secondary | ICD-10-CM | POA: Diagnosis not present

## 2020-06-20 ENCOUNTER — Other Ambulatory Visit (HOSPITAL_COMMUNITY): Payer: Self-pay

## 2020-06-20 MED ORDER — CITALOPRAM HYDROBROMIDE 10 MG PO TABS
10.0000 mg | ORAL_TABLET | Freq: Every day | ORAL | 0 refills | Status: AC
  Filled 2020-06-20: qty 90, 90d supply, fill #0

## 2020-06-26 DIAGNOSIS — L700 Acne vulgaris: Secondary | ICD-10-CM | POA: Diagnosis not present

## 2020-06-30 ENCOUNTER — Other Ambulatory Visit (HOSPITAL_COMMUNITY): Payer: Self-pay

## 2020-06-30 MED ORDER — VYVANSE 50 MG PO CAPS
ORAL_CAPSULE | ORAL | 0 refills | Status: DC
  Filled 2020-06-30: qty 30, 30d supply, fill #0

## 2020-07-01 ENCOUNTER — Other Ambulatory Visit (HOSPITAL_COMMUNITY): Payer: Self-pay

## 2020-07-30 DIAGNOSIS — L700 Acne vulgaris: Secondary | ICD-10-CM | POA: Diagnosis not present

## 2020-07-31 ENCOUNTER — Other Ambulatory Visit (HOSPITAL_COMMUNITY): Payer: Self-pay

## 2020-07-31 MED ORDER — VYVANSE 50 MG PO CAPS
ORAL_CAPSULE | ORAL | 0 refills | Status: DC
  Filled 2020-07-31: qty 30, 30d supply, fill #0

## 2020-08-26 DIAGNOSIS — J40 Bronchitis, not specified as acute or chronic: Secondary | ICD-10-CM | POA: Diagnosis not present

## 2020-08-26 DIAGNOSIS — J329 Chronic sinusitis, unspecified: Secondary | ICD-10-CM | POA: Diagnosis not present

## 2020-08-26 DIAGNOSIS — Z6829 Body mass index (BMI) 29.0-29.9, adult: Secondary | ICD-10-CM | POA: Diagnosis not present

## 2020-08-29 DIAGNOSIS — L700 Acne vulgaris: Secondary | ICD-10-CM | POA: Diagnosis not present

## 2020-09-08 ENCOUNTER — Other Ambulatory Visit (HOSPITAL_COMMUNITY): Payer: Self-pay

## 2020-09-08 MED ORDER — VYVANSE 50 MG PO CAPS
ORAL_CAPSULE | ORAL | 0 refills | Status: DC
  Filled 2020-09-08: qty 30, 30d supply, fill #0

## 2020-10-01 DIAGNOSIS — L700 Acne vulgaris: Secondary | ICD-10-CM | POA: Diagnosis not present

## 2020-10-14 DIAGNOSIS — L700 Acne vulgaris: Secondary | ICD-10-CM | POA: Diagnosis not present

## 2020-10-16 ENCOUNTER — Other Ambulatory Visit (HOSPITAL_COMMUNITY): Payer: Self-pay

## 2020-10-16 MED ORDER — VYVANSE 50 MG PO CAPS
ORAL_CAPSULE | ORAL | 0 refills | Status: AC
  Filled 2020-10-16: qty 30, 30d supply, fill #0

## 2020-11-05 ENCOUNTER — Other Ambulatory Visit (HOSPITAL_COMMUNITY): Payer: Self-pay

## 2020-11-05 MED ORDER — CITALOPRAM HYDROBROMIDE 10 MG PO TABS
ORAL_TABLET | ORAL | 0 refills | Status: DC
  Filled 2020-11-05: qty 30, 30d supply, fill #0

## 2020-11-24 ENCOUNTER — Other Ambulatory Visit (HOSPITAL_COMMUNITY): Payer: Self-pay

## 2020-11-25 ENCOUNTER — Other Ambulatory Visit (HOSPITAL_COMMUNITY): Payer: Self-pay

## 2020-11-25 MED ORDER — VYVANSE 50 MG PO CAPS
ORAL_CAPSULE | ORAL | 0 refills | Status: AC
  Filled 2020-11-25: qty 30, 30d supply, fill #0

## 2020-11-27 ENCOUNTER — Other Ambulatory Visit (HOSPITAL_COMMUNITY): Payer: Self-pay

## 2020-11-27 DIAGNOSIS — L700 Acne vulgaris: Secondary | ICD-10-CM | POA: Diagnosis not present

## 2021-01-07 ENCOUNTER — Other Ambulatory Visit (HOSPITAL_COMMUNITY): Payer: Self-pay

## 2021-01-07 MED ORDER — CITALOPRAM HYDROBROMIDE 10 MG PO TABS
ORAL_TABLET | ORAL | 0 refills | Status: DC
  Filled 2021-01-07: qty 30, 30d supply, fill #0

## 2021-01-16 ENCOUNTER — Other Ambulatory Visit (HOSPITAL_COMMUNITY): Payer: Self-pay

## 2021-01-16 MED ORDER — MOUNJARO 5 MG/0.5ML ~~LOC~~ SOAJ
SUBCUTANEOUS | 0 refills | Status: AC
  Filled 2021-01-16: qty 2, 28d supply, fill #0

## 2021-01-19 DIAGNOSIS — Z124 Encounter for screening for malignant neoplasm of cervix: Secondary | ICD-10-CM | POA: Diagnosis not present

## 2021-01-19 DIAGNOSIS — Z01419 Encounter for gynecological examination (general) (routine) without abnormal findings: Secondary | ICD-10-CM | POA: Diagnosis not present

## 2021-01-19 DIAGNOSIS — Z113 Encounter for screening for infections with a predominantly sexual mode of transmission: Secondary | ICD-10-CM | POA: Diagnosis not present

## 2021-01-19 DIAGNOSIS — Z6830 Body mass index (BMI) 30.0-30.9, adult: Secondary | ICD-10-CM | POA: Diagnosis not present

## 2021-01-19 DIAGNOSIS — Z01411 Encounter for gynecological examination (general) (routine) with abnormal findings: Secondary | ICD-10-CM | POA: Diagnosis not present

## 2021-01-20 ENCOUNTER — Other Ambulatory Visit (HOSPITAL_COMMUNITY): Payer: Self-pay

## 2021-01-20 MED ORDER — CITALOPRAM HYDROBROMIDE 10 MG PO TABS
ORAL_TABLET | ORAL | 3 refills | Status: AC
  Filled 2021-01-20: qty 90, 90d supply, fill #0
  Filled 2021-09-24: qty 90, 90d supply, fill #1

## 2021-01-31 ENCOUNTER — Other Ambulatory Visit (HOSPITAL_COMMUNITY): Payer: Self-pay

## 2021-02-02 ENCOUNTER — Other Ambulatory Visit (HOSPITAL_COMMUNITY): Payer: Self-pay

## 2021-02-02 MED ORDER — MOUNJARO 7.5 MG/0.5ML ~~LOC~~ SOAJ
SUBCUTANEOUS | 0 refills | Status: AC
  Filled 2021-02-02 (×4): qty 2, 28d supply, fill #0

## 2021-02-05 ENCOUNTER — Other Ambulatory Visit (HOSPITAL_COMMUNITY): Payer: Self-pay

## 2021-02-24 ENCOUNTER — Other Ambulatory Visit (HOSPITAL_COMMUNITY): Payer: Self-pay

## 2021-02-24 MED ORDER — MOUNJARO 10 MG/0.5ML ~~LOC~~ SOAJ
SUBCUTANEOUS | 0 refills | Status: DC
  Filled 2021-02-24: qty 2, 28d supply, fill #0

## 2021-02-26 ENCOUNTER — Other Ambulatory Visit (HOSPITAL_COMMUNITY): Payer: Self-pay

## 2021-02-27 ENCOUNTER — Other Ambulatory Visit (HOSPITAL_COMMUNITY): Payer: Self-pay

## 2021-03-24 ENCOUNTER — Other Ambulatory Visit (HOSPITAL_COMMUNITY): Payer: Self-pay

## 2021-03-24 MED ORDER — MOUNJARO 10 MG/0.5ML ~~LOC~~ SOAJ
SUBCUTANEOUS | 0 refills | Status: AC
  Filled 2021-03-24: qty 2, 28d supply, fill #0

## 2021-04-23 ENCOUNTER — Other Ambulatory Visit (HOSPITAL_COMMUNITY): Payer: Self-pay

## 2021-04-23 MED ORDER — MOUNJARO 12.5 MG/0.5ML ~~LOC~~ SOAJ
SUBCUTANEOUS | 0 refills | Status: DC
  Filled 2021-04-23: qty 2, 28d supply, fill #0

## 2021-05-19 ENCOUNTER — Other Ambulatory Visit (HOSPITAL_COMMUNITY): Payer: Self-pay

## 2021-05-19 MED ORDER — MOUNJARO 12.5 MG/0.5ML ~~LOC~~ SOAJ
SUBCUTANEOUS | 0 refills | Status: AC
  Filled 2021-05-28: qty 2, 28d supply, fill #0

## 2021-05-28 ENCOUNTER — Other Ambulatory Visit (HOSPITAL_COMMUNITY): Payer: Self-pay

## 2021-06-08 ENCOUNTER — Other Ambulatory Visit (HOSPITAL_COMMUNITY): Payer: Self-pay

## 2021-06-08 MED ORDER — MOUNJARO 15 MG/0.5ML ~~LOC~~ SOAJ
SUBCUTANEOUS | 0 refills | Status: DC
  Filled 2021-06-08 – 2021-07-01 (×2): qty 2, 28d supply, fill #0

## 2021-06-17 ENCOUNTER — Other Ambulatory Visit (HOSPITAL_COMMUNITY): Payer: Self-pay

## 2021-06-18 ENCOUNTER — Other Ambulatory Visit (HOSPITAL_COMMUNITY): Payer: Self-pay

## 2021-07-01 ENCOUNTER — Other Ambulatory Visit (HOSPITAL_COMMUNITY): Payer: Self-pay

## 2021-07-02 ENCOUNTER — Other Ambulatory Visit (HOSPITAL_COMMUNITY): Payer: Self-pay

## 2021-08-06 ENCOUNTER — Other Ambulatory Visit (HOSPITAL_COMMUNITY): Payer: Self-pay

## 2021-08-06 MED ORDER — MOUNJARO 15 MG/0.5ML ~~LOC~~ SOAJ
SUBCUTANEOUS | 0 refills | Status: DC
  Filled 2021-08-06: qty 2, 28d supply, fill #0

## 2021-08-19 DIAGNOSIS — F988 Other specified behavioral and emotional disorders with onset usually occurring in childhood and adolescence: Secondary | ICD-10-CM | POA: Diagnosis not present

## 2021-08-19 DIAGNOSIS — Z6827 Body mass index (BMI) 27.0-27.9, adult: Secondary | ICD-10-CM | POA: Diagnosis not present

## 2021-09-02 ENCOUNTER — Other Ambulatory Visit (HOSPITAL_COMMUNITY): Payer: Self-pay

## 2021-09-02 MED ORDER — MOUNJARO 15 MG/0.5ML ~~LOC~~ SOAJ
SUBCUTANEOUS | 0 refills | Status: AC
  Filled 2021-09-02: qty 2, 28d supply, fill #0

## 2021-09-08 ENCOUNTER — Other Ambulatory Visit (HOSPITAL_COMMUNITY): Payer: Self-pay

## 2021-09-24 ENCOUNTER — Other Ambulatory Visit (HOSPITAL_COMMUNITY): Payer: Self-pay

## 2021-09-25 ENCOUNTER — Other Ambulatory Visit (HOSPITAL_COMMUNITY): Payer: Self-pay

## 2021-09-26 ENCOUNTER — Other Ambulatory Visit (HOSPITAL_COMMUNITY): Payer: Self-pay

## 2021-09-26 MED ORDER — MOUNJARO 12.5 MG/0.5ML ~~LOC~~ SOAJ
SUBCUTANEOUS | 0 refills | Status: DC
  Filled 2021-09-26: qty 2, 28d supply, fill #0

## 2021-10-06 ENCOUNTER — Other Ambulatory Visit (HOSPITAL_COMMUNITY): Payer: Self-pay

## 2021-11-17 IMAGING — MR MR CERVICAL SPINE W/O CM
4 of 5 series · 19 of 48 positions shown · non-contrast
Comparison: 12/20/2018

CLINICAL DATA: Neck pain with right hand numbness

EXAM:
MRI CERVICAL SPINE WITHOUT CONTRAST
TECHNIQUE: Multiplanar, multisequence MR imaging of the cervical spine was
performed. No intravenous contrast was administered.

[Series 2: T2 · sagittal · 3.0mm · 0.41mm/px · 5 of 15 slices shown (1 of 2)]
[im 1/15]
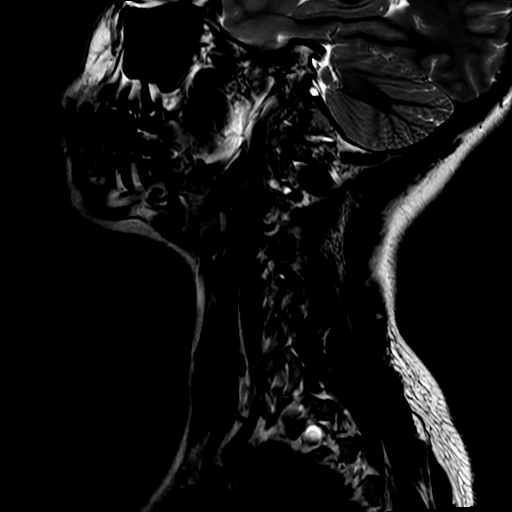
[im 4/15]
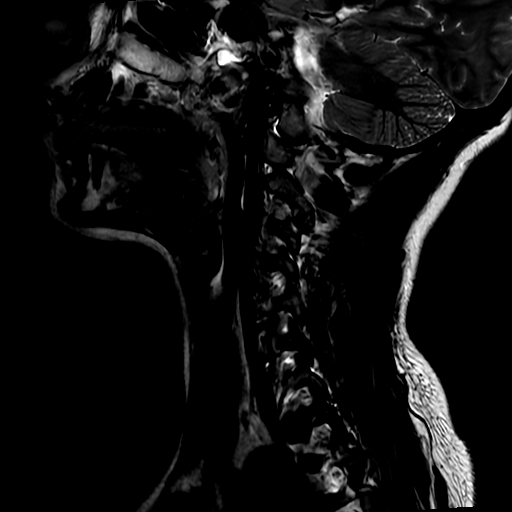
[im 8/15]
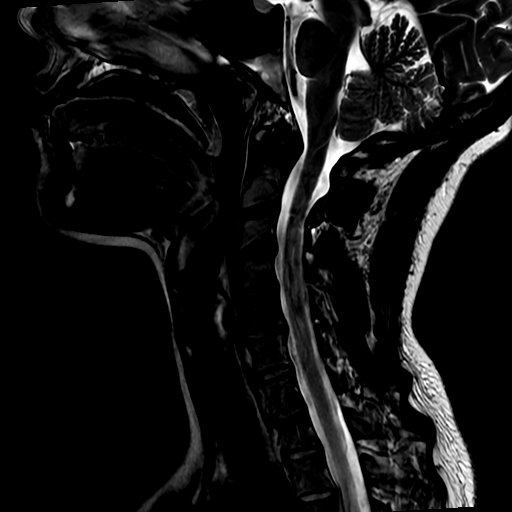
[im 11/15]
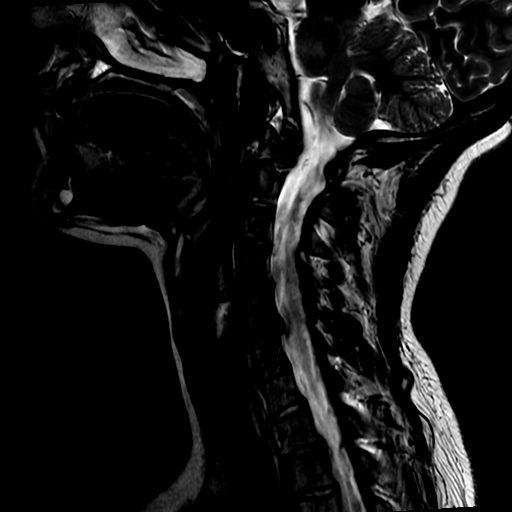
[im 15/15]
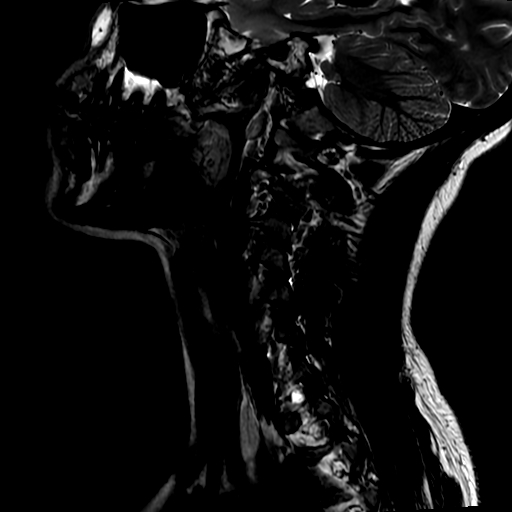

[Series 3: FLAIR · sagittal · 3.0mm · 0.41mm/px · 3 of 15 slices shown]
[im 1/15]
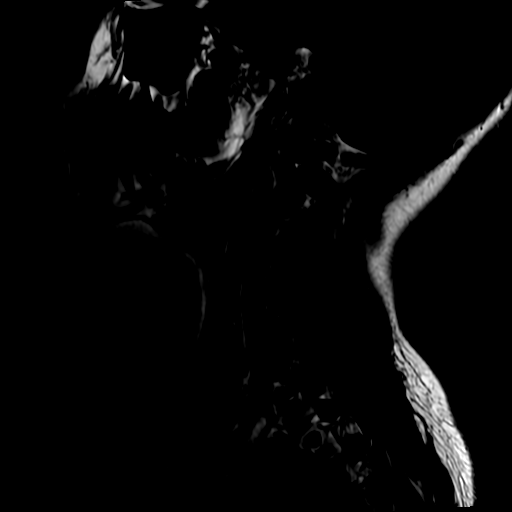
[im 10/15]
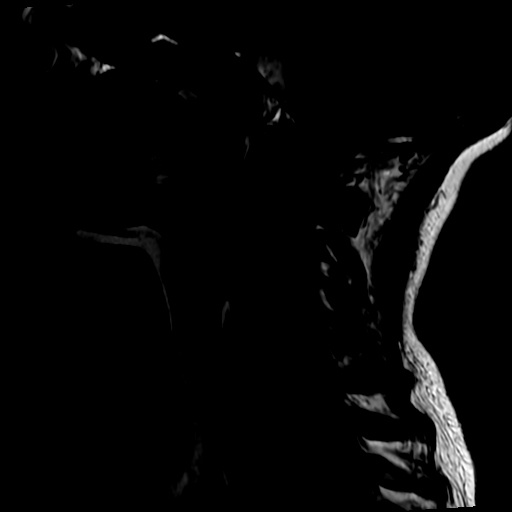
[im 15/15]
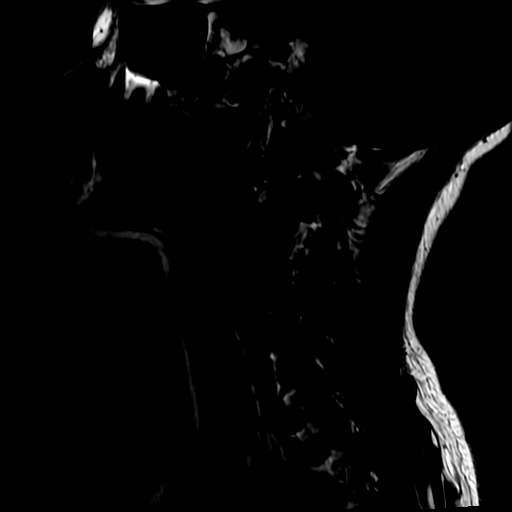

[Series 4: STIR · sagittal · 3.0mm · 0.41mm/px · 3 of 15 slices shown]
[im 1/15]
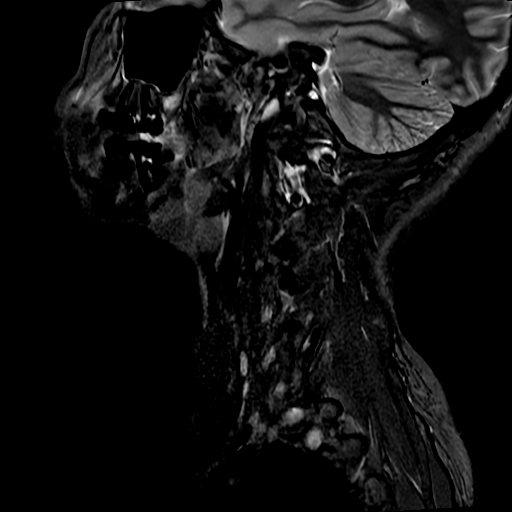
[im 10/15]
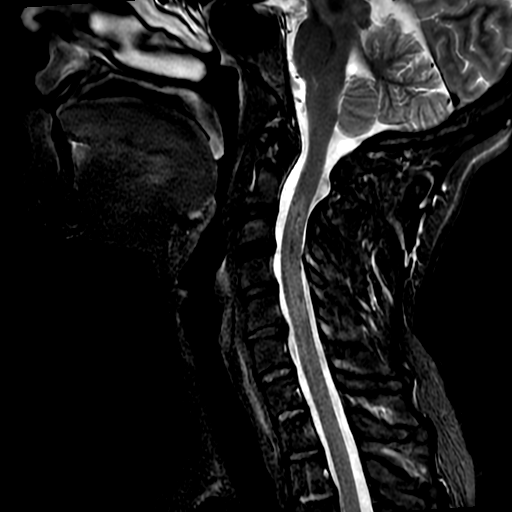
[im 15/15]
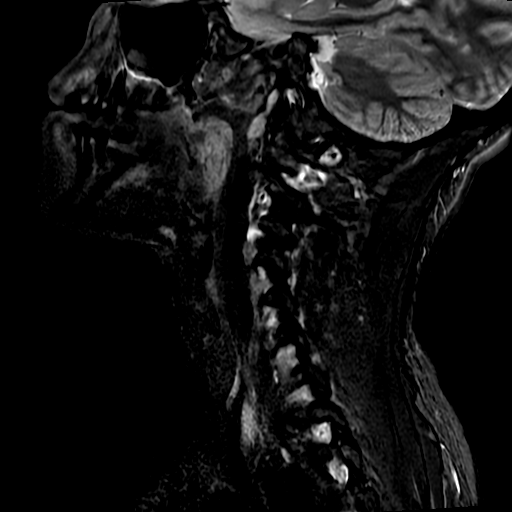

[Series 5: T2 · axial · 3.0mm · 0.35mm/px · z∈[-111,-25]mm · 8 of 31 slices shown (2 of 2)]
[im 1/31]
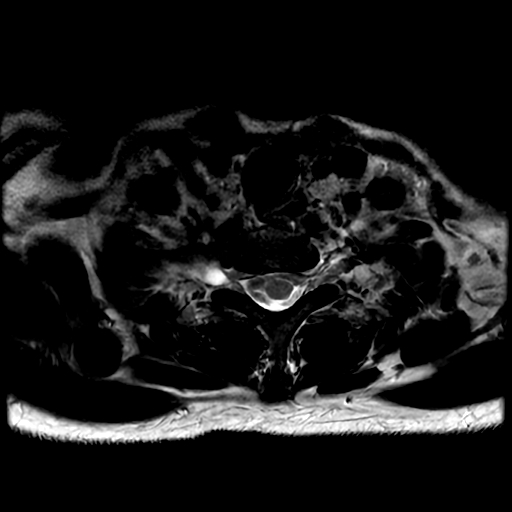
[im 4/31]
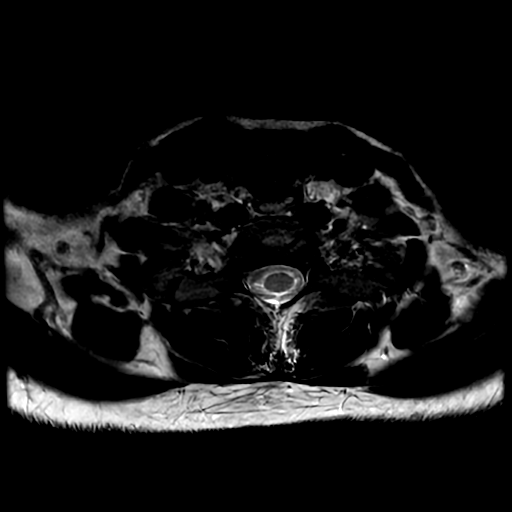
[im 8/31]
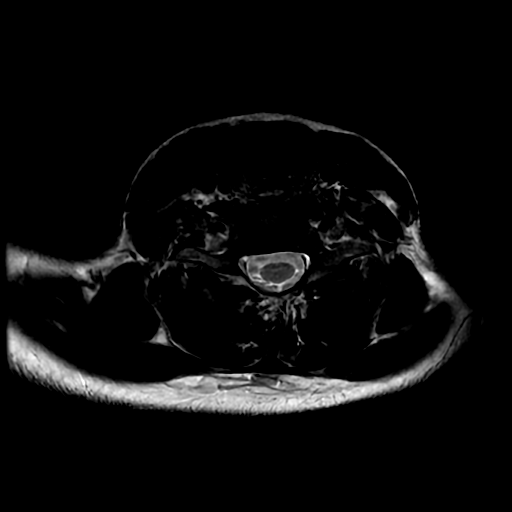
[im 12/31]
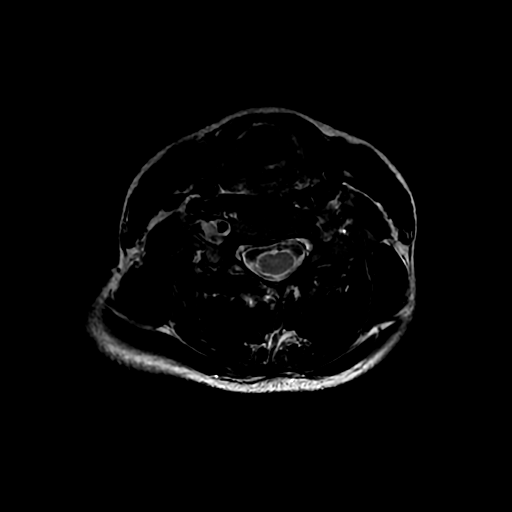
[im 16/31]
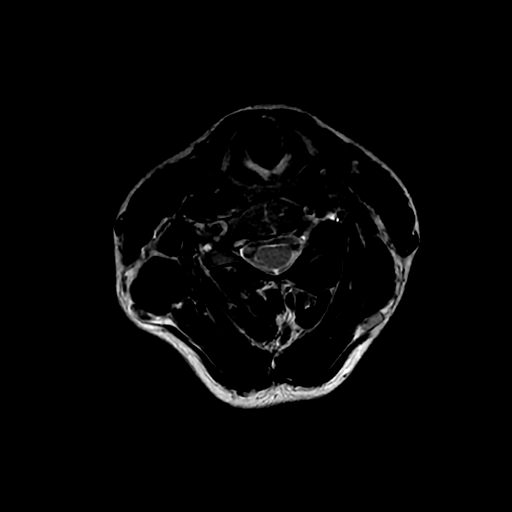
[im 19/31]
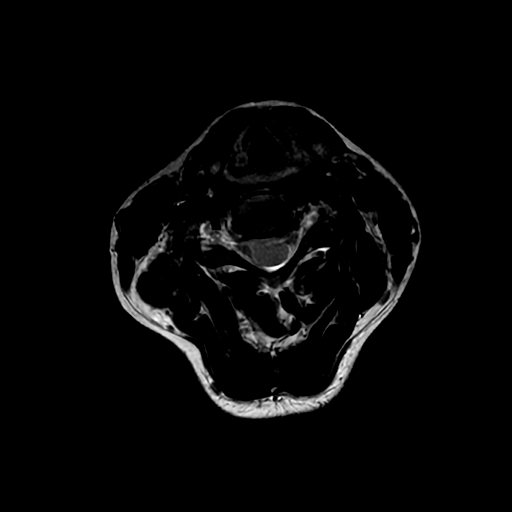
[im 23/31]
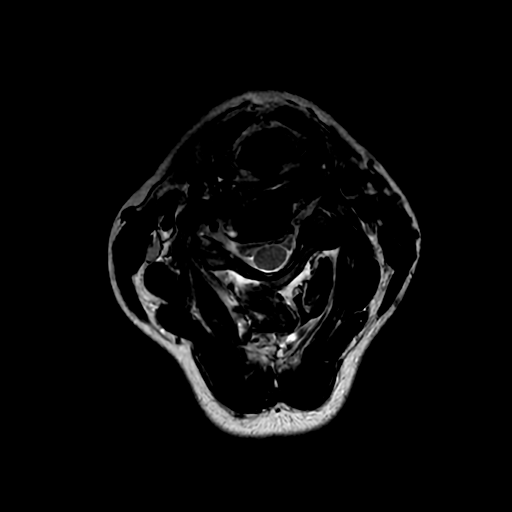
[im 27/31]
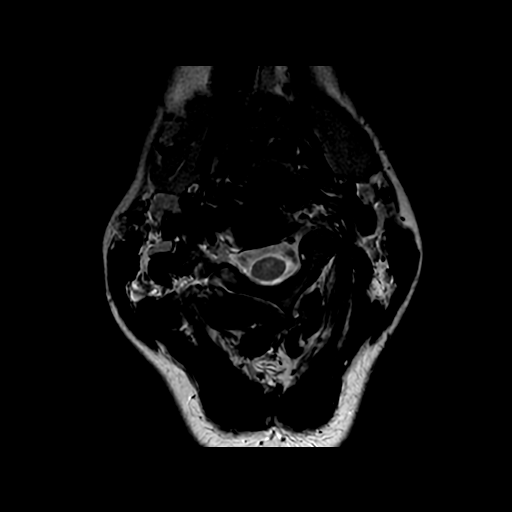

[19 of 48 positions shown; findings below may reference images not displayed]

FINDINGS: Alignment: Physiologic.

Vertebrae: No fracture, evidence of discitis, or bone lesion.

Cord: Normal signal and morphology.

Posterior Fossa, vertebral arteries, paraspinal tissues:
Developmentally diminutive left vertebral artery. No acute findings.

Disc levels:

C2-C3: No significant disc protrusion, foraminal stenosis, or canal
stenosis.

C3-C4: No significant disc protrusion, foraminal stenosis, or canal
stenosis.

C4-C5: No significant disc protrusion, foraminal stenosis, or canal
stenosis.

C5-C6: Minimal posterior disc osteophyte complex and mild right
greater than left uncovertebral spurring resulting in
borderline-mild right foraminal narrowing without evidence of neural
impingement. No canal stenosis. No appreciable progression from
prior.

C6-C7: No significant disc protrusion, foraminal stenosis, or canal
stenosis.

C7-T1: No significant disc protrusion, foraminal stenosis, or canal
stenosis.
IMPRESSION: 1. No acute osseous abnormality or abnormal cord signal.
2. Minimal degenerative changes at C5-C6 result in borderline-mild
right foraminal narrowing without evidence of neural impingement.
3. No significant foraminal or canal stenosis of the cervical spine.

## 2021-12-05 ENCOUNTER — Other Ambulatory Visit (HOSPITAL_COMMUNITY): Payer: Self-pay

## 2021-12-05 MED ORDER — MOUNJARO 12.5 MG/0.5ML ~~LOC~~ SOAJ
12.5000 mg | SUBCUTANEOUS | 0 refills | Status: DC
  Filled 2021-12-05: qty 2, 28d supply, fill #0

## 2021-12-14 ENCOUNTER — Other Ambulatory Visit (HOSPITAL_BASED_OUTPATIENT_CLINIC_OR_DEPARTMENT_OTHER): Payer: Self-pay

## 2022-02-15 ENCOUNTER — Other Ambulatory Visit: Payer: Self-pay

## 2022-02-15 ENCOUNTER — Other Ambulatory Visit (HOSPITAL_COMMUNITY): Payer: Self-pay

## 2022-02-15 MED ORDER — MOUNJARO 12.5 MG/0.5ML ~~LOC~~ SOAJ
12.5000 mg | SUBCUTANEOUS | 0 refills | Status: DC
  Filled 2022-02-15 – 2022-02-25 (×2): qty 2, 28d supply, fill #0

## 2022-02-16 ENCOUNTER — Other Ambulatory Visit: Payer: Self-pay

## 2022-02-23 ENCOUNTER — Other Ambulatory Visit: Payer: Self-pay

## 2022-02-25 ENCOUNTER — Other Ambulatory Visit (HOSPITAL_COMMUNITY): Payer: Self-pay

## 2022-02-28 ENCOUNTER — Other Ambulatory Visit (HOSPITAL_COMMUNITY): Payer: Self-pay

## 2022-03-02 ENCOUNTER — Other Ambulatory Visit (HOSPITAL_COMMUNITY): Payer: Self-pay

## 2022-03-02 ENCOUNTER — Other Ambulatory Visit: Payer: Self-pay

## 2022-03-02 MED ORDER — CITALOPRAM HYDROBROMIDE 10 MG PO TABS
10.0000 mg | ORAL_TABLET | Freq: Every day | ORAL | 0 refills | Status: AC
  Filled 2022-03-02: qty 90, 90d supply, fill #0

## 2022-03-04 ENCOUNTER — Other Ambulatory Visit (HOSPITAL_COMMUNITY): Payer: Self-pay

## 2022-04-20 ENCOUNTER — Other Ambulatory Visit (HOSPITAL_COMMUNITY): Payer: Self-pay

## 2022-04-20 MED ORDER — MOUNJARO 12.5 MG/0.5ML ~~LOC~~ SOAJ
12.5000 mg | SUBCUTANEOUS | 0 refills | Status: DC
  Filled 2022-04-20: qty 2, 28d supply, fill #0

## 2022-04-23 DIAGNOSIS — Z6828 Body mass index (BMI) 28.0-28.9, adult: Secondary | ICD-10-CM | POA: Diagnosis not present

## 2022-04-23 DIAGNOSIS — R87610 Atypical squamous cells of undetermined significance on cytologic smear of cervix (ASC-US): Secondary | ICD-10-CM | POA: Diagnosis not present

## 2022-04-23 DIAGNOSIS — Z01419 Encounter for gynecological examination (general) (routine) without abnormal findings: Secondary | ICD-10-CM | POA: Diagnosis not present

## 2022-04-26 ENCOUNTER — Other Ambulatory Visit (HOSPITAL_COMMUNITY): Payer: Self-pay

## 2022-04-27 ENCOUNTER — Other Ambulatory Visit: Payer: Self-pay

## 2022-05-12 ENCOUNTER — Other Ambulatory Visit (HOSPITAL_COMMUNITY): Payer: Self-pay

## 2022-05-17 ENCOUNTER — Other Ambulatory Visit (HOSPITAL_COMMUNITY): Payer: Self-pay

## 2022-05-17 MED ORDER — MOUNJARO 12.5 MG/0.5ML ~~LOC~~ SOAJ
12.5000 mg | SUBCUTANEOUS | 0 refills | Status: AC
  Filled 2022-05-17 (×2): qty 2, 28d supply, fill #0

## 2022-05-18 ENCOUNTER — Other Ambulatory Visit (HOSPITAL_COMMUNITY): Payer: Self-pay

## 2022-06-09 ENCOUNTER — Other Ambulatory Visit (HOSPITAL_COMMUNITY): Payer: Self-pay

## 2022-06-09 ENCOUNTER — Other Ambulatory Visit: Payer: Self-pay

## 2022-06-09 ENCOUNTER — Other Ambulatory Visit: Payer: Self-pay | Admitting: Nurse Practitioner

## 2022-06-09 DIAGNOSIS — J0140 Acute pansinusitis, unspecified: Secondary | ICD-10-CM

## 2022-06-09 MED ORDER — IPRATROPIUM BROMIDE 0.03 % NA SOLN: 2.0000 | Freq: Two times a day (BID) | NASAL | 12 refills | Status: AC

## 2022-06-09 MED ORDER — MOUNJARO 15 MG/0.5ML ~~LOC~~ SOAJ
15.0000 mg | SUBCUTANEOUS | 1 refills | Status: AC
  Filled 2022-06-09: qty 2, 28d supply, fill #0

## 2022-06-09 MED ORDER — PSEUDOEPH-BROMPHEN-DM 30-2-10 MG/5ML PO SYRP
5.0000 mL | ORAL_SOLUTION | Freq: Four times a day (QID) | ORAL | 0 refills | Status: AC | PRN
Start: 2022-06-09 — End: ?

## 2022-06-09 MED ORDER — AMOXICILLIN-POT CLAVULANATE 875-125 MG PO TABS
1.0000 | ORAL_TABLET | Freq: Two times a day (BID) | ORAL | 0 refills | Status: AC
Start: 2022-06-09 — End: ?

## 2022-06-09 NOTE — Progress Notes (Signed)
Order transfer from [facility]

## 2023-10-12 ENCOUNTER — Telehealth: Admitting: Physician Assistant

## 2023-10-12 DIAGNOSIS — R3989 Other symptoms and signs involving the genitourinary system: Secondary | ICD-10-CM

## 2023-10-12 MED ORDER — NITROFURANTOIN MONOHYD MACRO 100 MG PO CAPS
100.0000 mg | ORAL_CAPSULE | Freq: Two times a day (BID) | ORAL | 0 refills | Status: AC
Start: 2023-10-12 — End: ?

## 2023-10-12 NOTE — Progress Notes (Signed)
 Virtual Visit Consent   Amanda Barker, you are scheduled for a virtual visit with a [facility] provider today. Just as with appointments in the office, your consent must be obtained to participate. Your consent will be active for this visit and any virtual visit you may have with one of our providers in the next 365 days. If you have a MyChart account, a copy of this consent can be sent to you electronically.  As this is a virtual visit, video technology does not allow for your provider to perform a traditional examination. This may limit your provider's ability to fully assess your condition. If your provider identifies any concerns that need to be evaluated in person or the need to arrange testing (such as labs, EKG, etc.), we will make arrangements to do so. Although advances in technology are sophisticated, we cannot ensure that it will always work on either your end or our end. If the connection with a video visit is poor, the visit may have to be switched to a telephone visit. With either a video or telephone visit, we are not always able to ensure that we have a secure connection.  By engaging in this virtual visit, you consent to the provision of healthcare and authorize for your insurance to be billed (if applicable) for the services provided during this visit. Depending on your insurance coverage, you may receive a charge related to this service.  I need to obtain your verbal consent now. Are you willing to proceed with your visit today? Amanda Barker has provided verbal consent on 10/12/2023 for a virtual visit (video or telephone). Amanda CHRISTELLA Dickinson, PA-C  Date: 10/12/2023 12:19 PM   Virtual Visit via Video Note   I, Amanda Barker, connected with  Amanda Barker  ([id number], 1990/11/02) on 10/12/23 at 12:15 PM EDT by a video-enabled telemedicine application and verified that I am speaking with the correct person using two identifiers.  Location: Patient: Virtual  Visit Location Patient: Home Provider: Virtual Visit Location Provider: Home Office   I discussed the limitations of evaluation and management by telemedicine and the availability of in person appointments. The patient expressed understanding and agreed to proceed.    History of Present Illness: Amanda Barker is a 33 y.o. who identifies as a female who was assigned female at birth, and is being seen today for dysuria.  HPI: Urinary Tract Infection  This is a new problem. The current episode started today. The problem occurs every urination. The problem has been gradually worsening. The quality of the pain is described as burning. The pain is mild. There has been no fever. Associated symptoms include frequency, hesitancy and urgency. Pertinent negatives include no chills, discharge, flank pain, hematuria, nausea or possible pregnancy (has IUD). She has tried increased fluids for the symptoms. The treatment provided no relief.     Problems:  Patient Active Problem List   Diagnosis Date Noted   Postpartum care following vaginal delivery (8/24) 10/23/2014   Pregnancy 10/22/2014   Elevated blood pressure affecting pregnancy in third trimester, antepartum 10/05/2014   Nephrolithiasis 09/20/2010   Urinary tract infection 09/20/2010   SVT (supraventricular tachycardia) (HCC)     Allergies:  Allergies  Allergen Reactions   Sulfa Antibiotics Hives and Rash   Medications:  Current Outpatient Medications:    nitrofurantoin , macrocrystal-monohydrate, (MACROBID ) 100 MG capsule, Take 1 capsule (100 mg total) by mouth 2 (two) times daily., Disp: 10 capsule, Rfl: 0   acetaminophen  (TYLENOL ) 500  MG tablet, Take 1,000 mg by mouth every 4 (four) hours as needed for mild pain., Disp: , Rfl:    amoxicillin -clavulanate (AUGMENTIN ) 875-125 MG tablet, Take 1 tablet by mouth 2 (two) times daily., Disp: 14 tablet, Rfl: 0   brompheniramine-pseudoephedrine-DM 30-2-10 MG/5ML syrup, Take 5 mLs by mouth 4  (four) times daily as needed., Disp: 120 mL, Rfl: 0   citalopram  (CELEXA ) 10 MG tablet, Take 10 mg by mouth at bedtime., Disp: , Rfl:    citalopram  (CELEXA ) 10 MG tablet, TAKE 1 TABLET BY MOUTH DAILY, Disp: 90 tablet, Rfl: 2   citalopram  (CELEXA ) 10 MG tablet, Take 1 tablet (10 mg total) by mouth daily., Disp: 90 tablet, Rfl: 0   citalopram  (CELEXA ) 10 MG tablet, Take 1 tablet by mouth once daily, Disp: 90 tablet, Rfl: 3   citalopram  (CELEXA ) 10 MG tablet, Take 1 tablet (10 mg total) by mouth daily., Disp: 90 tablet, Rfl: 0   ibuprofen  (ADVIL ) 200 MG tablet, Take 600-800 mg by mouth every 6 (six) hours as needed for moderate pain., Disp: , Rfl:    ipratropium (ATROVENT ) 0.03 % nasal spray, Place 2 sprays into both nostrils every 12 (twelve) hours., Disp: 30 mL, Rfl: 12   lisdexamfetamine (VYVANSE ) 50 MG capsule, TAKE 1 CAPSULE BY MOUTH EVERY MORNING, Disp: 30 capsule, Rfl: 0   lisdexamfetamine (VYVANSE ) 50 MG capsule, TAKE 1 CAPSULE BY MOUTH EVERY MORNING, Disp: 30 capsule, Rfl: 0   lisdexamfetamine (VYVANSE ) 50 MG capsule, TAKE 1 CAPSULE BY MOUTH EVERY MORNING, Disp: 30 capsule, Rfl: 0   lisdexamfetamine (VYVANSE ) 50 MG capsule, Take 1 capsule by mouth every morning., Disp: 30 capsule, Rfl: 0   lisdexamfetamine (VYVANSE ) 50 MG capsule, Take 1 (one) Capsule by mouth every morning., Disp: 30 capsule, Rfl: 0   methocarbamol  (ROBAXIN ) 500 MG tablet, Take 1 tablet (500 mg total) by mouth 2 (two) times daily., Disp: 20 tablet, Rfl: 0   MOUNJARO  10 MG/0.5ML Pen, Inject 10 mg into the skin once a week, Disp: 2 mL, Rfl: 0   MOUNJARO  12.5 MG/0.5ML Pen, Inject 1 pen (12.5 mg) into the skin every week, Disp: 2 mL, Rfl: 0   MOUNJARO  12.5 MG/0.5ML Pen, Inject 12.5 mg subcutaneously every week., Disp: 2 mL, Rfl: 0   MOUNJARO  15 MG/0.5ML Pen, Inject 15 mg into the skin once every week, Disp: 2 mL, Rfl: 0   MOUNJARO  7.5 MG/0.5ML Pen, Inject 7.5 mg under the skin every week, Disp: 2 mL, Rfl: 0   phentermine  (ADIPEX-P) 37.5 MG tablet, Take 18.75 mg by mouth daily before breakfast., Disp: , Rfl:    predniSONE  (STERAPRED UNI-PAK 21 TAB) 10 MG (21) TBPK tablet, Take by mouth daily. Take 6 tabs by mouth daily  for 2 days, then 5 tabs for 2 days, then 4 tabs for 2 days, then 3 tabs for 2 days, 2 tabs for 2 days, then 1 tab by mouth daily for 2 days, Disp: 42 tablet, Rfl: 0   tirzepatide  (MOUNJARO ) 5 MG/0.5ML Pen, Inject 5 mg into the skin once a week, Disp: 2 mL, Rfl: 0  Observations/Objective: Patient is well-developed, well-nourished in no acute distress.  Resting comfortably at home.  Head is normocephalic, atraumatic.  No labored breathing.  Speech is clear and coherent with logical content.  Patient is alert and oriented at baseline.    Assessment and Plan: 1. Suspected UTI (Primary) - nitrofurantoin , macrocrystal-monohydrate, (MACROBID ) 100 MG capsule; Take 1 capsule (100 mg total) by mouth 2 (two) times daily.  Dispense: 10 capsule; Refill: 0  - Worsening symptoms.  - Will treat empirically with Macrobid  - May use AZO for bladder spasms - Continue to push fluids.  - Seek in person evaluation for urine culture if symptoms do not improve or if they worsen.    Follow Up Instructions: I discussed the assessment and treatment plan with the patient. The patient was provided an opportunity to ask questions and all were answered. The patient agreed with the plan and demonstrated an understanding of the instructions.  A copy of instructions were sent to the patient via MyChart unless otherwise noted below.    The patient was advised to call back or seek an in-person evaluation if the symptoms worsen or if the condition fails to improve as anticipated.    Amanda CHRISTELLA Dickinson, PA-C

## 2023-10-12 NOTE — Patient Instructions (Signed)
 Amanda Barker, thank you for joining Amanda CHRISTELLA Dickinson, PA-C for today's virtual visit.  While this provider is not your primary care provider (PCP), if your PCP is located in our provider database this encounter information will be shared with them immediately following your visit.   A [facility] MyChart account gives you access to today's visit and all your visits, tests, and labs performed at [facility]  click here if you don't have a [facility] MyChart account or go to [email or web address]  Consent: (Patient) Amanda Barker provided verbal consent for this virtual visit at the beginning of the encounter.  Current Medications:  Current Outpatient Medications:    nitrofurantoin , macrocrystal-monohydrate, (MACROBID ) 100 MG capsule, Take 1 capsule (100 mg total) by mouth 2 (two) times daily., Disp: 10 capsule, Rfl: 0   acetaminophen  (TYLENOL ) 500 MG tablet, Take 1,000 mg by mouth every 4 (four) hours as needed for mild pain., Disp: , Rfl:    amoxicillin -clavulanate (AUGMENTIN ) 875-125 MG tablet, Take 1 tablet by mouth 2 (two) times daily., Disp: 14 tablet, Rfl: 0   brompheniramine-pseudoephedrine-DM 30-2-10 MG/5ML syrup, Take 5 mLs by mouth 4 (four) times daily as needed., Disp: 120 mL, Rfl: 0   citalopram  (CELEXA ) 10 MG tablet, Take 10 mg by mouth at bedtime., Disp: , Rfl:    citalopram  (CELEXA ) 10 MG tablet, TAKE 1 TABLET BY MOUTH DAILY, Disp: 90 tablet, Rfl: 2   citalopram  (CELEXA ) 10 MG tablet, Take 1 tablet (10 mg total) by mouth daily., Disp: 90 tablet, Rfl: 0   citalopram  (CELEXA ) 10 MG tablet, Take 1 tablet by mouth once daily, Disp: 90 tablet, Rfl: 3   citalopram  (CELEXA ) 10 MG tablet, Take 1 tablet (10 mg total) by mouth daily., Disp: 90 tablet, Rfl: 0   ibuprofen  (ADVIL ) 200 MG tablet, Take 600-800 mg by mouth every 6 (six) hours as needed for moderate pain., Disp: , Rfl:    ipratropium (ATROVENT ) 0.03 % nasal spray, Place 2 sprays into both nostrils  every 12 (twelve) hours., Disp: 30 mL, Rfl: 12   lisdexamfetamine (VYVANSE ) 50 MG capsule, TAKE 1 CAPSULE BY MOUTH EVERY MORNING, Disp: 30 capsule, Rfl: 0   lisdexamfetamine (VYVANSE ) 50 MG capsule, TAKE 1 CAPSULE BY MOUTH EVERY MORNING, Disp: 30 capsule, Rfl: 0   lisdexamfetamine (VYVANSE ) 50 MG capsule, TAKE 1 CAPSULE BY MOUTH EVERY MORNING, Disp: 30 capsule, Rfl: 0   lisdexamfetamine (VYVANSE ) 50 MG capsule, Take 1 capsule by mouth every morning., Disp: 30 capsule, Rfl: 0   lisdexamfetamine (VYVANSE ) 50 MG capsule, Take 1 (one) Capsule by mouth every morning., Disp: 30 capsule, Rfl: 0   methocarbamol  (ROBAXIN ) 500 MG tablet, Take 1 tablet (500 mg total) by mouth 2 (two) times daily., Disp: 20 tablet, Rfl: 0   MOUNJARO  10 MG/0.5ML Pen, Inject 10 mg into the skin once a week, Disp: 2 mL, Rfl: 0   MOUNJARO  12.5 MG/0.5ML Pen, Inject 1 pen (12.5 mg) into the skin every week, Disp: 2 mL, Rfl: 0   MOUNJARO  12.5 MG/0.5ML Pen, Inject 12.5 mg subcutaneously every week., Disp: 2 mL, Rfl: 0   MOUNJARO  15 MG/0.5ML Pen, Inject 15 mg into the skin once every week, Disp: 2 mL, Rfl: 0   MOUNJARO  7.5 MG/0.5ML Pen, Inject 7.5 mg under the skin every week, Disp: 2 mL, Rfl: 0   phentermine (ADIPEX-P) 37.5 MG tablet, Take 18.75 mg by mouth daily before breakfast., Disp: , Rfl:    predniSONE  (STERAPRED UNI-PAK 21 TAB) 10 MG (21) TBPK  tablet, Take by mouth daily. Take 6 tabs by mouth daily  for 2 days, then 5 tabs for 2 days, then 4 tabs for 2 days, then 3 tabs for 2 days, 2 tabs for 2 days, then 1 tab by mouth daily for 2 days, Disp: 42 tablet, Rfl: 0   tirzepatide  (MOUNJARO ) 5 MG/0.5ML Pen, Inject 5 mg into the skin once a week, Disp: 2 mL, Rfl: 0   Medications ordered in this encounter:  Meds ordered this encounter  Medications   nitrofurantoin , macrocrystal-monohydrate, (MACROBID ) 100 MG capsule    Sig: Take 1 capsule (100 mg total) by mouth 2 (two) times daily.    Dispense:  10 capsule    Refill:  0     Supervising Provider:   BLAISE ALEENE KIDD [[id number]]     *If you need refills on other medications prior to your next appointment, please contact your pharmacy*  Follow-Up: Call back or seek an in-person evaluation if the symptoms worsen or if the condition fails to improve as anticipated.  [facility] [phone]  Other Instructions Urinary Tract Infection, Female A urinary tract infection (UTI) is an infection in your urinary tract. The urinary tract is made up of organs that make, store, and get rid of pee (urine) in your body. These organs include: The kidneys. The ureters. The bladder. The urethra. What are the causes? Most UTIs are caused by germs called bacteria. They may be in or near your genitals. These germs grow and cause swelling in your urinary tract. What increases the risk? You're more likely to get a UTI if: You're a female. The urethra is shorter in females than in males. You have a soft tube called a catheter that drains your pee. You can't control when you pee or poop. You have trouble peeing because of: A kidney stone. A urinary blockage. A nerve condition that affects your bladder. Not getting enough to drink. You're sexually active. You use a birth control inside your vagina, like spermicide. You're pregnant. You have low levels of the hormone estrogen in your body. You're an older adult. You're also more likely to get a UTI if you have other health problems. These may include: Diabetes. A weak immune system. Your immune system is your body's defense system. Sickle cell disease. Injury of the spine. What are the signs or symptoms? Symptoms may include: Needing to pee right away. Peeing small amounts often. Pain or burning when you pee. Blood in your pee. Pee that smells bad or odd. Pain in your belly or lower back. You may also: Feel confused. This may be the first symptom in older adults. Vomit. Not feel hungry. Feel tired or  easily annoyed. Have a fever or chills. How is this diagnosed? A UTI is diagnosed based on your medical history and an exam. You may also have other tests. These may include: Pee tests. Blood tests. Tests for sexually transmitted infections (STIs). If you've had more than one UTI, you may need to have imaging studies done to find out why you keep getting them. How is this treated? A UTI can be treated by: Taking antibiotics or other medicines. Drinking enough fluid to keep your pee pale yellow. In rare cases, a UTI can cause a very bad condition called sepsis. Sepsis may be treated in the hospital. Follow these instructions at home: Medicines Take your medicines only as told by your health care provider. If you were given antibiotics, take them as told by your  provider. Do not stop taking them even if you start to feel better. General instructions Make sure you: Pee often and fully. Do not hold your pee for a long time. Wipe from front to back after you pee or poop. Use each tissue only once when you wipe. Pee after you have sex. Do not douche or use sprays or powders in your genital area. Contact a health care provider if: Your symptoms don't get better after 1-2 days of taking antibiotics. Your symptoms go away and then come back. You have a fever or chills. You vomit or feel like you may vomit. Get help right away if: You have very bad pain in your back or lower belly. You faint. This information is not intended to replace advice given to you by your health care provider. Make sure you discuss any questions you have with your health care provider. Document Revised: 01/26/2023 Document Reviewed: 05/21/2022 Elsevier Patient Education  [employer].   If you have been instructed to have an in-person evaluation today at a local Urgent Care facility, please use the link below. It will take you to a list of all of our available [facility], including address, phone  number and hours of operation. Please do not delay care.  [facility]  If you or a family member do not have a primary care provider, use the link below to schedule a visit and establish care. When you choose a [facility] primary care physician or advanced practice provider, you gain a long-term partner in health. Find a Primary Care Provider  Learn more about 's in-office and virtual care options:  - Get Care Now
